# Patient Record
Sex: Female | Born: 1990 | Race: Black or African American | Hispanic: No | Marital: Single | Smoking: Never smoker
Health system: Southern US, Community
[De-identification: ages and names within clinical notes are randomized; demographics above are authoritative.]

## PROBLEM LIST (undated history)

## (undated) ENCOUNTER — Inpatient Hospital Stay (HOSPITAL_COMMUNITY): Payer: Self-pay

## (undated) DIAGNOSIS — D649 Anemia, unspecified: Secondary | ICD-10-CM

## (undated) DIAGNOSIS — I10 Essential (primary) hypertension: Secondary | ICD-10-CM

## (undated) HISTORY — PX: NO PAST SURGERIES: SHX2092

---

## 2015-12-06 ENCOUNTER — Encounter (HOSPITAL_COMMUNITY): Payer: Self-pay | Admitting: Emergency Medicine

## 2015-12-06 ENCOUNTER — Emergency Department (INDEPENDENT_AMBULATORY_CARE_PROVIDER_SITE_OTHER)
Admission: EM | Admit: 2015-12-06 | Discharge: 2015-12-06 | Disposition: A | Discharge status: Home / Self Care | Payer: Medicaid Other | Source: Home / Self Care | Attending: Family Medicine | Admitting: Family Medicine

## 2015-12-06 DIAGNOSIS — Z3490 Encounter for supervision of normal pregnancy, unspecified, unspecified trimester: Secondary | ICD-10-CM

## 2015-12-06 DIAGNOSIS — Z3201 Encounter for pregnancy test, result positive: Secondary | ICD-10-CM | POA: Diagnosis not present

## 2015-12-06 LAB — POCT PREGNANCY, URINE: PREG TEST UR: POSITIVE — AB

## 2015-12-06 NOTE — ED Notes (Signed)
Wants pregnancy test and documentation for work.  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  Has performed 2 home pregnancy tests that are positive

## 2015-12-06 NOTE — ED Provider Notes (Signed)
CSN: [id number]     Arrival date & time 12/06/15  1345 History   First MD Initiated Contact with Patient 12/06/15 1505     Chief Complaint  Patient presents with  . Possible Pregnancy   (Consider location/radiation/quality/duration/timing/severity/associated sxs/prior Treatment) Patient is a 24 y.o. female presenting with pregnancy problem. The history is provided by the patient.  Possible Pregnancy This is a new problem. Episode onset: lmp in 11/16, no birth control, nulliparous. The problem has not changed since onset.Associated symptoms comments: No bleeding, or pain.Marland Kitchen    History reviewed. No pertinent past medical history. History reviewed. No pertinent past surgical history. No family history on file. Social History  Substance Use Topics  . Smoking status: Never Smoker   . Smokeless tobacco: None  . Alcohol Use: No   OB History    No data available     Review of Systems  Constitutional: Negative.   Gastrointestinal: Negative.   Genitourinary: Positive for menstrual problem.  Musculoskeletal: Negative.   Skin: Negative.   All other systems reviewed and are negative.   Allergies  Flagyl  Home Medications   Prior to Admission medications   Not on File   Meds Ordered and Administered this Visit  Medications - No data to display  BP 142/69 mmHg  Pulse 86  Temp(Src) 98.3 F (36.8 C) (Oral)  SpO2 98%  LMP 10/25/2015 No data found.   Physical Exam  Constitutional: She is oriented to person, place, and time. She appears well-developed and well-nourished.  Abdominal: Soft. Bowel sounds are normal. There is no tenderness.  Neurological: She is alert and oriented to person, place, and time.  Skin: Skin is warm and dry.  Nursing note and vitals reviewed.   ED Course  Procedures (including critical care time)  Labs Review Labs Reviewed  POCT PREGNANCY, URINE - Abnormal; Notable for the following:    Preg Test, Ur POSITIVE (*)    All other components  within normal limits    Imaging Review No results found.   Visual Acuity Review  Right Eye Distance:   Left Eye Distance:   Bilateral Distance:    Right Eye Near:   Left Eye Near:    Bilateral Near:         MDM   1. Pregnancy        Billy Fischer, MD 12/06/15 [phone]

## 2015-12-06 NOTE — Discharge Instructions (Signed)
See your doctor for prenatal care. Start a prenatal vitamin.

## 2015-12-10 NOTE — L&D Delivery Note (Signed)
Patient is 25 y.o. G1P0 [redacted]w[redacted]d admitted for IOL 2/2 to postdates. She received Cytotec, foley bulb, and pitocin for induction.   Delivery Note At 2:08 PM a viable female was delivered via Vaginal, Spontaneous Delivery (Presentation:  Right Occiput Anterior ) with terminal meconium.  APGAR: 7, 8; weight pending .   Placenta status: spontaneous and intact. Cord: 3 vessel with the following complications: none.  Cord pH: sent   Anesthesia:  Epidural  Episiotomy: None Lacerations: None Suture Repair: none Est. Blood Loss (mL): 100  Mom to postpartum.  Baby to Couplet care / Skin to Skin.  Melina Schools, DO  08/08/2016, 2:58 PM   OB FELLOW DELIVERY ATTESTATION  I was gloved and present for the delivery in its entirety, and I agree with the above resident's note.    Katherine Basset, DO OB Fellow 11:25 PM

## 2015-12-14 ENCOUNTER — Emergency Department (HOSPITAL_COMMUNITY)
Admission: EM | Admit: 2015-12-14 | Discharge: 2015-12-14 | Disposition: A | Discharge status: Home / Self Care | Payer: PRIVATE HEALTH INSURANCE | Attending: Emergency Medicine | Admitting: Emergency Medicine

## 2015-12-14 ENCOUNTER — Emergency Department (HOSPITAL_COMMUNITY): Payer: PRIVATE HEALTH INSURANCE

## 2015-12-14 ENCOUNTER — Encounter (HOSPITAL_COMMUNITY): Payer: Self-pay | Admitting: Neurology

## 2015-12-14 DIAGNOSIS — R102 Pelvic and perineal pain: Secondary | ICD-10-CM

## 2015-12-14 DIAGNOSIS — O9989 Other specified diseases and conditions complicating pregnancy, childbirth and the puerperium: Secondary | ICD-10-CM | POA: Insufficient documentation

## 2015-12-14 DIAGNOSIS — R1030 Lower abdominal pain, unspecified: Secondary | ICD-10-CM | POA: Diagnosis not present

## 2015-12-14 DIAGNOSIS — Z79899 Other long term (current) drug therapy: Secondary | ICD-10-CM | POA: Insufficient documentation

## 2015-12-14 DIAGNOSIS — O210 Mild hyperemesis gravidarum: Secondary | ICD-10-CM | POA: Insufficient documentation

## 2015-12-14 DIAGNOSIS — Z3A01 Less than 8 weeks gestation of pregnancy: Secondary | ICD-10-CM | POA: Diagnosis not present

## 2015-12-14 DIAGNOSIS — O26899 Other specified pregnancy related conditions, unspecified trimester: Secondary | ICD-10-CM

## 2015-12-14 DIAGNOSIS — R112 Nausea with vomiting, unspecified: Secondary | ICD-10-CM

## 2015-12-14 DIAGNOSIS — R109 Unspecified abdominal pain: Secondary | ICD-10-CM

## 2015-12-14 LAB — URINALYSIS, ROUTINE W REFLEX MICROSCOPIC
Bilirubin Urine: NEGATIVE
Glucose, UA: NEGATIVE mg/dL
Hgb urine dipstick: NEGATIVE
Ketones, ur: >80 mg/dL — AB
LEUKOCYTES UA: NEGATIVE
NITRITE: NEGATIVE
PH: 5.5 (ref 5.0–8.0)
Protein, ur: NEGATIVE mg/dL
SPECIFIC GRAVITY, URINE: 1.021 (ref 1.005–1.030)

## 2015-12-14 LAB — CBC
HEMATOCRIT: 37.2 % (ref 36.0–46.0)
HEMOGLOBIN: 12.5 g/dL (ref 12.0–15.0)
MCH: 29.3 pg (ref 26.0–34.0)
MCHC: 33.6 g/dL (ref 30.0–36.0)
MCV: 87.1 fL (ref 78.0–100.0)
Platelets: 336 10*3/uL (ref 150–400)
RBC: 4.27 MIL/uL (ref 3.87–5.11)
RDW: 12.3 % (ref 11.5–15.5)
WBC: 6 10*3/uL (ref 4.0–10.5)

## 2015-12-14 LAB — COMPREHENSIVE METABOLIC PANEL
ALBUMIN: 3.7 g/dL (ref 3.5–5.0)
ALT: 12 U/L — ABNORMAL LOW (ref 14–54)
ANION GAP: 10 (ref 5–15)
AST: 17 U/L (ref 15–41)
Alkaline Phosphatase: 60 U/L (ref 38–126)
BILIRUBIN TOTAL: 0.8 mg/dL (ref 0.3–1.2)
BUN: 6 mg/dL (ref 6–20)
CALCIUM: 9.4 mg/dL (ref 8.9–10.3)
CO2: 22 mmol/L (ref 22–32)
Chloride: 101 mmol/L (ref 101–111)
Creatinine, Ser: 0.57 mg/dL (ref 0.44–1.00)
GFR calc non Af Amer: >60 mL/min (ref >60)
GLUCOSE: 84 mg/dL (ref 65–99)
POTASSIUM: 3.6 mmol/L (ref 3.5–5.1)
Sodium: 133 mmol/L — ABNORMAL LOW (ref 135–145)
TOTAL PROTEIN: 7.9 g/dL (ref 6.5–8.1)

## 2015-12-14 LAB — I-STAT BETA HCG BLOOD, ED (MC, WL, AP ONLY)

## 2015-12-14 LAB — LIPASE, BLOOD: Lipase: 25 U/L (ref 11–51)

## 2015-12-14 MED ORDER — SODIUM CHLORIDE 0.9 % IV BOLUS (SEPSIS)
1000.0000 mL | Freq: Once | INTRAVENOUS | Status: AC
  Administered 2015-12-14: 1000 mL via INTRAVENOUS

## 2015-12-14 MED ORDER — ONDANSETRON HCL 4 MG/2ML IJ SOLN
4.0000 mg | Freq: Once | INTRAMUSCULAR | Status: AC
  Administered 2015-12-14: 4 mg via INTRAVENOUS
  Filled 2015-12-14: qty 2

## 2015-12-14 MED ORDER — ONDANSETRON HCL 4 MG PO TABS: 4.0000 mg | ORAL_TABLET | Freq: Four times a day (QID) | ORAL | Status: DC

## 2015-12-14 NOTE — ED Notes (Signed)
Pt reports generalized abd pain for 1 week, is also [redacted] weeks pregnant. Has been vomiting and thinks she is dehydrated.

## 2015-12-14 NOTE — ED Notes (Signed)
Pt states she is unable to void at present 

## 2015-12-14 NOTE — ED Provider Notes (Signed)
CSN: [id number]     Arrival date & time 12/14/15  0714 History   First MD Initiated Contact with Patient 12/14/15 [phone]     Chief Complaint  Patient presents with  . Abdominal Pain  . Emesis    (Consider location/radiation/quality/duration/timing/severity/associated sxs/prior Treatment) Patient is a 25 y.o. female presenting with abdominal pain and vomiting. The history is provided by the patient. No language interpreter was used.  Abdominal Pain Associated symptoms: vomiting   Emesis Associated symptoms: abdominal pain     Melanie Lopez is a 25 year old female who is [redacted] weeks pregnant with no significant past medical history who presents for generalized abdominal pain 1 week and multiple episodes of vomiting. She states she tried to get an appointment at the health Department but they could not schedule her for another month. She reports feeling dehydrated. She denies having a previous ultrasound and states that she was diagnosed with pregnancy at urgent care. She reports being constipated for the past couple of weeks. She denies any treatment prior to arrival. Her last menstrual period was 10/25/2015. G1 P0 A0 She denies any fever, chills, shortness of breath, diarrhea, dysuria, hematuria, vaginal bleeding or discharge.  History reviewed. No pertinent past medical history. History reviewed. No pertinent past surgical history. No family history on file. Social History  Substance Use Topics  . Smoking status: Never Smoker   . Smokeless tobacco: None  . Alcohol Use: No   OB History    No data available     Review of Systems  Gastrointestinal: Positive for vomiting and abdominal pain.  All other systems reviewed and are negative.     Allergies  Flagyl  Home Medications   Prior to Admission medications   Medication Sig Start Date End Date Taking? Authorizing Provider  Iron-Vitamins (GERITOL PO) Take 1 tablet by mouth daily as needed (low iron levels).   Yes Historical  Provider, MD  ondansetron (ZOFRAN) 4 MG tablet Take 1 tablet (4 mg total) by mouth every 6 (six) hours. 12/14/15   Naidelyn Parrella Patel-Mills, PA-C   BP 116/68 mmHg  Pulse 53  Temp(Src) 98.4 F (36.9 C) (Oral)  Resp 16  SpO2 92%  LMP 10/25/2015 Physical Exam  Constitutional: She is oriented to person, place, and time. She appears well-developed and well-nourished.  HENT:  Head: Normocephalic and atraumatic.  Eyes: Conjunctivae are normal.  Neck: Normal range of motion. Neck supple.  Cardiovascular: Normal rate, regular rhythm and normal heart sounds.   Pulmonary/Chest: Effort normal and breath sounds normal. No respiratory distress.  Abdominal: Soft. There is tenderness.    Abdominal tenderness as diagrammed. No guarding or rebound.  Musculoskeletal: Normal range of motion.  Neurological: She is alert and oriented to person, place, and time.  Skin: Skin is warm and dry.  Nursing note and vitals reviewed.   ED Course  Procedures (including critical care time) Labs Review Labs Reviewed  COMPREHENSIVE METABOLIC PANEL - Abnormal; Notable for the following:    Sodium 133 (*)    ALT 12 (*)    All other components within normal limits  URINALYSIS, ROUTINE W REFLEX MICROSCOPIC (NOT AT [facility]) - Abnormal; Notable for the following:    Ketones, ur >80 (*)    All other components within normal limits  I-STAT BETA HCG BLOOD, ED ([facility], [facility], [facility] ONLY) - Abnormal; Notable for the following:    I-stat hCG, quantitative >2000.0 (*)    All other components within normal limits  LIPASE, BLOOD  CBC    Imaging  Review US Ob Comp Less 14 Wks  12/14/2015  CLINICAL DATA:  One week history of abdominal and pelvic pain with nausea and vomiting EXAM: OBSTETRIC <14 WK [place] AND TRANSVAGINAL OB US TECHNIQUE: Both transabdominal and transvaginal ultrasound examinations were performed for complete evaluation of the gestation as well as the maternal uterus, adnexal regions, and pelvic cul-de-sac. Transvaginal technique  was performed to assess early pregnancy. COMPARISON:  None. FINDINGS: Intrauterine gestational sac: Visualized/normal in shape. Yolk sac:  Visualized Embryo:  Visualized Cardiac Activity: Visualized Heart Rate: 133  bpm CRL:  10  mm   7 w   1 d                  [place] [facility]: July 31, 2016 Subchorionic hemorrhage: There is a 6 x 3 mm subchorionic hemorrhage. Maternal uterus/adnexae: Cervical os is closed. No intrauterine mass appreciable. Maternal ovaries appear symmetric and normal bilaterally. A small amount of free pelvic fluid is noted. IMPRESSION: Single live intrauterine gestation with estimated gestational age of approximately 55 weeks. Rather tiny subchorionic hemorrhage. Small amount of free pelvic fluid may indicate recent ovarian cyst rupture. No extrauterine pelvic masses are identified. Electronically Signed   By: Lowella Grip III M.D.   On: 12/14/2015 11:51   US Ob Transvaginal  12/14/2015  CLINICAL DATA:  One week history of abdominal and pelvic pain with nausea and vomiting EXAM: OBSTETRIC <14 WK [place] AND TRANSVAGINAL OB US TECHNIQUE: Both transabdominal and transvaginal ultrasound examinations were performed for complete evaluation of the gestation as well as the maternal uterus, adnexal regions, and pelvic cul-de-sac. Transvaginal technique was performed to assess early pregnancy. COMPARISON:  None. FINDINGS: Intrauterine gestational sac: Visualized/normal in shape. Yolk sac:  Visualized Embryo:  Visualized Cardiac Activity: Visualized Heart Rate: 133  bpm CRL:  10  mm   7 w   1 d                  [place] [facility]: July 31, 2016 Subchorionic hemorrhage: There is a 6 x 3 mm subchorionic hemorrhage. Maternal uterus/adnexae: Cervical os is closed. No intrauterine mass appreciable. Maternal ovaries appear symmetric and normal bilaterally. A small amount of free pelvic fluid is noted. IMPRESSION: Single live intrauterine gestation with estimated gestational age of approximately 71 weeks. Rather tiny subchorionic  hemorrhage. Small amount of free pelvic fluid may indicate recent ovarian cyst rupture. No extrauterine pelvic masses are identified. Electronically Signed   By: Lowella Grip III M.D.   On: 12/14/2015 11:51   I have personally reviewed and evaluated these images and lab results as part of my medical decision-making.   EKG Interpretation None      MDM   Final diagnoses:  Abdominal pain affecting pregnancy  Non-intractable vomiting with nausea, vomiting of unspecified type  Patient who is [redacted] weeks pregnant, G1 P0 A0, presents with lower abdominal pain and nausea with multiple episodes of vomiting times one week. She has not seen an OB/GYN to date. She was diagnosed at urgent care a few weeks ago. She is well-appearing and in no acute distress. Her vital signs are stable. An transvaginal and pelvic ultrasound were ordered to rule out ectopic pregnancy. Patient was given IV Zofran and fluids.  Her labs are unremarkable. Ultrasound shows single intrauterine gestation with a heart rate of 133 and gestational age of approximately 53 weeks. There is a tiny subchorionic hemorrhage. She denies any vaginal bleeding or discharge. It also suggests that there may be a recent ovarian cyst  rupture. Urinalysis is negative for UTI.  I discussed findings with the patient as well as follow-up with women's outpatient clinic or the health department. She was prescribed Zofran for nausea. Return precautions were discussed and patient agrees with plan. She was given a work note.    Ottie Glazier, PA-C 12/14/15 [place], MD 12/15/15 [phone]

## 2015-12-14 NOTE — Discharge Instructions (Signed)
Abdominal Pain, Adult Many things can cause belly (abdominal) pain. Most times, the belly pain is not dangerous. Many cases of belly pain can be watched and treated at home. HOME CARE   Do not take medicines that help you go poop (laxatives) unless told to by your doctor.  Only take medicine as told by your doctor.  Eat or drink as told by your doctor. Your doctor will tell you if you should be on a special diet. GET HELP IF:  You do not know what is causing your belly pain.  You have belly pain while you are sick to your stomach (nauseous) or have runny poop (diarrhea).  You have pain while you pee or poop.  Your belly pain wakes you up at night.  You have belly pain that gets worse or better when you eat.  You have belly pain that gets worse when you eat fatty foods.  You have a fever. GET HELP RIGHT AWAY IF:   The pain does not go away within 2 hours.  You keep throwing up (vomiting).  The pain changes and is only in the right or left part of the belly.  You have bloody or tarry looking poop. MAKE SURE YOU:   Understand these instructions.  Will watch your condition.  Will get help right away if you are not doing well or get worse.   This information is not intended to replace advice given to you by your health care provider. Make sure you discuss any questions you have with your health care provider.   Document Released: 05/13/2008 Document Revised: 12/16/2014 Document Reviewed: 08/04/2013 Elsevier Interactive Patient Education 2016 Elsevier Inc.  Nausea and Vomiting Nausea means you feel sick to your stomach. Throwing up (vomiting) is a reflex where stomach contents come out of your mouth. HOME CARE   Take medicine as told by your doctor.  Do not force yourself to eat. However, you do need to drink fluids.  If you feel like eating, eat a normal diet as told by your doctor.  Eat rice, wheat, potatoes, bread, lean meats, yogurt, fruits, and  vegetables.  Avoid high-fat foods.  Drink enough fluids to keep your pee (urine) clear or pale yellow.  Ask your doctor how to replace body fluid losses (rehydrate). Signs of body fluid loss (dehydration) include:  Feeling very thirsty.  Dry lips and mouth.  Feeling dizzy.  Dark pee.  Peeing less than normal.  Feeling confused.  Fast breathing or heart rate. GET HELP RIGHT AWAY IF:   You have blood in your throw up.  You have black or bloody poop (stool).  You have a bad headache or stiff neck.  You feel confused.  You have bad belly (abdominal) pain.  You have chest pain or trouble breathing.  You do not pee at least once every 8 hours.  You have cold, clammy skin.  You keep throwing up after 24 to 48 hours.  You have a fever. MAKE SURE YOU:   Understand these instructions.  Will watch your condition.  Will get help right away if you are not doing well or get worse.   This information is not intended to replace advice given to you by your health care provider. Make sure you discuss any questions you have with your health care provider.   Document Released: 05/13/2008 Document Revised: 02/17/2012 Document Reviewed: 04/26/2011 Elsevier Interactive Patient Education [employer].

## 2015-12-14 NOTE — ED Notes (Signed)
Patient transported to Ultrasound 

## 2015-12-21 ENCOUNTER — Encounter (HOSPITAL_COMMUNITY): Payer: Self-pay | Admitting: Emergency Medicine

## 2015-12-21 DIAGNOSIS — O418X11 Other specified disorders of amniotic fluid and membranes, first trimester, fetus 1: Secondary | ICD-10-CM | POA: Insufficient documentation

## 2015-12-21 DIAGNOSIS — O9989 Other specified diseases and conditions complicating pregnancy, childbirth and the puerperium: Secondary | ICD-10-CM | POA: Diagnosis present

## 2015-12-21 DIAGNOSIS — Z3A08 8 weeks gestation of pregnancy: Secondary | ICD-10-CM | POA: Diagnosis not present

## 2015-12-21 DIAGNOSIS — O210 Mild hyperemesis gravidarum: Secondary | ICD-10-CM | POA: Diagnosis not present

## 2015-12-21 LAB — URINALYSIS, ROUTINE W REFLEX MICROSCOPIC
BILIRUBIN URINE: NEGATIVE
GLUCOSE, UA: NEGATIVE mg/dL
Hgb urine dipstick: NEGATIVE
Ketones, ur: >80 mg/dL — AB
Leukocytes, UA: NEGATIVE
NITRITE: NEGATIVE
PH: 6 (ref 5.0–8.0)
Protein, ur: 30 mg/dL — AB
SPECIFIC GRAVITY, URINE: 1.036 — AB (ref 1.005–1.030)

## 2015-12-21 LAB — COMPREHENSIVE METABOLIC PANEL
ALBUMIN: 3.8 g/dL (ref 3.5–5.0)
ALT: 11 U/L — ABNORMAL LOW (ref 14–54)
ANION GAP: 12 (ref 5–15)
AST: 15 U/L (ref 15–41)
Alkaline Phosphatase: 54 U/L (ref 38–126)
CHLORIDE: 102 mmol/L (ref 101–111)
CO2: 22 mmol/L (ref 22–32)
Calcium: 9.6 mg/dL (ref 8.9–10.3)
Creatinine, Ser: 0.64 mg/dL (ref 0.44–1.00)
GFR calc Af Amer: >60 mL/min (ref >60)
GFR calc non Af Amer: >60 mL/min (ref >60)
GLUCOSE: 81 mg/dL (ref 65–99)
POTASSIUM: 3.9 mmol/L (ref 3.5–5.1)
SODIUM: 136 mmol/L (ref 135–145)
TOTAL PROTEIN: 8.1 g/dL (ref 6.5–8.1)
Total Bilirubin: 0.4 mg/dL (ref 0.3–1.2)

## 2015-12-21 LAB — CBC
HEMATOCRIT: 38 % (ref 36.0–46.0)
HEMOGLOBIN: 12.8 g/dL (ref 12.0–15.0)
MCH: 29.2 pg (ref 26.0–34.0)
MCHC: 33.7 g/dL (ref 30.0–36.0)
MCV: 86.8 fL (ref 78.0–100.0)
Platelets: 381 10*3/uL (ref 150–400)
RBC: 4.38 MIL/uL (ref 3.87–5.11)
RDW: 12.2 % (ref 11.5–15.5)
WBC: 6 10*3/uL (ref 4.0–10.5)

## 2015-12-21 LAB — I-STAT BETA HCG BLOOD, ED (MC, WL, AP ONLY): I-stat hCG, quantitative: >2000 m[IU]/mL — ABNORMAL HIGH (ref <5)

## 2015-12-21 LAB — URINE MICROSCOPIC-ADD ON

## 2015-12-21 LAB — LIPASE, BLOOD: Lipase: 28 U/L (ref 11–51)

## 2015-12-21 MED ORDER — ONDANSETRON 4 MG PO TBDP
ORAL_TABLET | ORAL | Status: AC
  Filled 2015-12-21: qty 1

## 2015-12-21 MED ORDER — ONDANSETRON 4 MG PO TBDP
4.0000 mg | ORAL_TABLET | Freq: Once | ORAL | Status: AC | PRN
  Administered 2015-12-21: 4 mg via ORAL

## 2015-12-21 NOTE — ED Notes (Addendum)
Pt states she has been having lower abd pain and vomiting for 1 week. Pt was seen here a week ago and no better today. Pt states she has had 6 episodes of vomiting today. Pt also states she is [redacted] weeks pregnant.

## 2015-12-22 ENCOUNTER — Emergency Department (HOSPITAL_COMMUNITY)
Admission: EM | Admit: 2015-12-22 | Discharge: 2015-12-22 | Disposition: A | Discharge status: Home / Self Care | Payer: PRIVATE HEALTH INSURANCE | Attending: Emergency Medicine | Admitting: Emergency Medicine

## 2015-12-22 ENCOUNTER — Emergency Department (HOSPITAL_COMMUNITY): Payer: PRIVATE HEALTH INSURANCE

## 2015-12-22 DIAGNOSIS — O418X11 Other specified disorders of amniotic fluid and membranes, first trimester, fetus 1: Secondary | ICD-10-CM | POA: Diagnosis not present

## 2015-12-22 DIAGNOSIS — O219 Vomiting of pregnancy, unspecified: Secondary | ICD-10-CM

## 2015-12-22 DIAGNOSIS — O468X1 Other antepartum hemorrhage, first trimester: Secondary | ICD-10-CM

## 2015-12-22 DIAGNOSIS — O26899 Other specified pregnancy related conditions, unspecified trimester: Secondary | ICD-10-CM

## 2015-12-22 DIAGNOSIS — R109 Unspecified abdominal pain: Secondary | ICD-10-CM

## 2015-12-22 DIAGNOSIS — O418X10 Other specified disorders of amniotic fluid and membranes, first trimester, not applicable or unspecified: Secondary | ICD-10-CM

## 2015-12-22 MED ORDER — SODIUM CHLORIDE 0.9 % IV BOLUS (SEPSIS)
1000.0000 mL | Freq: Once | INTRAVENOUS | Status: AC
  Administered 2015-12-22: 1000 mL via INTRAVENOUS

## 2015-12-22 MED ORDER — ONDANSETRON HCL 4 MG PO TABS: 4.0000 mg | ORAL_TABLET | Freq: Three times a day (TID) | ORAL | Status: DC | PRN

## 2015-12-22 MED ORDER — ONDANSETRON HCL 4 MG/2ML IJ SOLN
4.0000 mg | Freq: Once | INTRAMUSCULAR | Status: AC
  Administered 2015-12-22: 4 mg via INTRAVENOUS
  Filled 2015-12-22: qty 2

## 2015-12-22 MED ORDER — THIAMINE HCL 100 MG/ML IJ SOLN
100.0000 mg | Freq: Every day | INTRAMUSCULAR | Status: DC
  Administered 2015-12-22: 100 mg via INTRAVENOUS
  Filled 2015-12-22: qty 2

## 2015-12-22 MED ORDER — DOXYLAMINE-PYRIDOXINE 10-10 MG PO TBEC: 1.0000 | DELAYED_RELEASE_TABLET | Freq: Every evening | ORAL | Status: DC | PRN

## 2015-12-22 NOTE — ED Provider Notes (Signed)
CSN: [id number]     Arrival date & time 12/21/15  2236 History  By signing my name below, I, Arianna Nassar, attest that this documentation has been prepared under the direction and in the presence of Merryl Hacker, MD. Electronically Signed: Julien Nordmann, ED Scribe. 12/22/2015. 3:51 AM.    Chief Complaint  Patient presents with  . Abdominal Pain      The history is provided by the patient. No language interpreter was used.   HPI Comments: Melanie Lopez is a 25 y.o. female who is [redacted]wk pregnant presents to the Emergency Department complaining of constant, gradual worsening lower abdominal pain with associated vaginal discharge and vomiting onset one week ago. Pt states she has had 6 episodes of vomiting today. She reports having increased abdominal pain when she lays down and is unable to lay on her right side at all due to increased pain. Pt denies vaginal bleeding and constipation.  History reviewed. No pertinent past medical history. History reviewed. No pertinent past surgical history. No family history on file. Social History  Substance Use Topics  . Smoking status: Never Smoker   . Smokeless tobacco: None  . Alcohol Use: No   OB History    No data available     Review of Systems  Gastrointestinal: Positive for vomiting and abdominal pain. Negative for constipation.  Genitourinary: Positive for vaginal discharge. Negative for vaginal bleeding.  All other systems reviewed and are negative.     Allergies  Flagyl  Home Medications   Prior to Admission medications   Medication Sig Start Date End Date Taking? Authorizing Provider  Doxylamine-Pyridoxine (DICLEGIS) 10-10 MG TBEC Take 1 tablet by mouth at bedtime as needed. 12/22/15   Merryl Hacker, MD  Iron-Vitamins (GERITOL PO) Take 1 tablet by mouth daily as needed (low iron levels).    Historical Provider, MD  ondansetron (ZOFRAN) 4 MG tablet Take 1 tablet (4 mg total) by mouth every 8 (eight) hours as needed for  nausea or vomiting. 12/22/15   Merryl Hacker, MD   Triage vitals: BP 120/73 mmHg  Pulse 74  Temp(Src) 98.1 F (36.7 C) (Oral)  Resp 16  Ht 5\' 5"  (1.651 m)  Wt 153 lb 7 oz (69.599 kg)  BMI 25.53 kg/m2  SpO2 100%  LMP 10/25/2015 Physical Exam  Constitutional: She is oriented to person, place, and time. She appears well-developed and well-nourished.  HENT:  Head: Normocephalic and atraumatic.  Eyes: Pupils are equal, round, and reactive to light.  Cardiovascular: Normal rate, regular rhythm and normal heart sounds.   No murmur heard. Pulmonary/Chest: Effort normal and breath sounds normal. No respiratory distress. She has no wheezes.  Abdominal: Soft. Bowel sounds are normal. There is tenderness. There is no rebound and no guarding.  MIld suprapubic TTP, no lateralization, no peritonitis  Neurological: She is alert and oriented to person, place, and time.  Skin: Skin is warm and dry.  Psychiatric: She has a normal mood and affect.  Nursing note and vitals reviewed.   ED Course  Procedures  DIAGNOSTIC STUDIES: Oxygen Saturation is 100% on RA, normal by my interpretation.  COORDINATION OF CARE:  3:42 AM Discussed treatment plan which includes fluids, [place] with pt at bedside and pt agreed to plan.  Labs Review Labs Reviewed  COMPREHENSIVE METABOLIC PANEL - Abnormal; Notable for the following:    BUN <5 (*)    ALT 11 (*)    All other components within normal limits  URINALYSIS, ROUTINE W REFLEX  MICROSCOPIC (NOT AT [facility]) - Abnormal; Notable for the following:    Color, Urine AMBER (*)    Specific Gravity, Urine 1.036 (*)    Ketones, ur >80 (*)    Protein, ur 30 (*)    All other components within normal limits  URINE MICROSCOPIC-ADD ON - Abnormal; Notable for the following:    Squamous Epithelial / LPF 6-30 (*)    Bacteria, UA RARE (*)    All other components within normal limits  I-STAT BETA HCG BLOOD, ED ([facility], [facility], [facility] ONLY) - Abnormal; Notable for the following:    I-stat  hCG, quantitative >2000.0 (*)    All other components within normal limits  LIPASE, BLOOD  CBC    Imaging Review US Ob Comp Less 14 Wks  12/22/2015  CLINICAL DATA:  25 year old pregnant female with abdominal pain, nausea and vomiting EXAM: OBSTETRIC <14 WK [place] AND TRANSVAGINAL OB US TECHNIQUE: Both transabdominal and transvaginal ultrasound examinations were performed for complete evaluation of the gestation as well as the maternal uterus, adnexal regions, and pelvic cul-de-sac. Transvaginal technique was performed to assess early pregnancy. COMPARISON:  Ultrasound dated 12/14/2015 FINDINGS: Intrauterine gestational sac: Single intrauterine gestational sac Yolk sac:  Seen Embryo:  Present Cardiac Activity: Detected Heart Rate: 152  bpm CRL:  19  mm   8 w   3 d                  [place] [facility]: 07/30/2016 Subchorionic hemorrhage:  None visualized. Maternal uterus/adnexae: The maternal ovaries appear unremarkable. The right ovary measures 3.5 x 2.6 x 2.5 cm and the left ovary measures 3.1 x 2.0 x 2.6 cm. Trace free fluid noted within the pelvis. Low-level echogenic debris noted within the bladder. IMPRESSION: Single live intrauterine pregnancy with an estimated gestational age of [redacted] weeks, 2 days based on the first ultrasound. Small subchorionic hemorrhage. Electronically Signed   By: Anner Crete M.D.   On: 12/22/2015 05:20   US Ob Transvaginal  12/22/2015  CLINICAL DATA:  25 year old pregnant female with abdominal pain, nausea and vomiting EXAM: OBSTETRIC <14 WK [place] AND TRANSVAGINAL OB US TECHNIQUE: Both transabdominal and transvaginal ultrasound examinations were performed for complete evaluation of the gestation as well as the maternal uterus, adnexal regions, and pelvic cul-de-sac. Transvaginal technique was performed to assess early pregnancy. COMPARISON:  Ultrasound dated 12/14/2015 FINDINGS: Intrauterine gestational sac: Single intrauterine gestational sac Yolk sac:  Seen Embryo:  Present Cardiac Activity:  Detected Heart Rate: 152  bpm CRL:  19  mm   8 w   3 d                  [place] ED[facility]: 07/30/2016 Subchorionic hemorrhage:  None visualized. Maternal uterus/adnexae: The maternal ovaries appear unremarkable. The right ovary measures 3.5 x 2.6 x 2.5 cm and the left ovary measures 3.1 x 2.0 x 2.6 cm. Trace free fluid noted within the pelvis. Low-level echogenic debris noted within the bladder. IMPRESSION: Single live intrauterine pregnancy with an estimated gestational age of [redacted] weeks, 2 days based on the first ultrasound. Small subchorionic hemorrhage. Electronically Signed   By: Anner Crete M.D.   On: 12/22/2015 05:20   I have personally reviewed and evaluated these images and lab results as part of my medical decision-making.   EKG Interpretation None      MDM   Final diagnoses:  Vomiting during pregnancy  Subchorionic hematoma in first trimester    Patient with vomiting and abdominal pain in early  pregnancy.  Has had an [place] to show intrauterine pregnancy.  >80 ketones in the urine.  Patient given fluids, zofran and thiamine.  Repeat US obtained and continues to be reassuring.  Patient improved and able to PO challenge after hydration.  Discussed with patient the recent studies regarding zofran in early pregnancy.  She states that it works well for her.  I have recommended trying diclegis and if that does not help, then she will be given zofran as a back up.  Call OB for close follow-up.   After history, exam, and medical workup I feel the patient has been appropriately medically screened and is safe for discharge home. Pertinent diagnoses were discussed with the patient. Patient was given return precautions.  I personally performed the services described in this documentation, which was scribed in my presence. The recorded information has been reviewed and is accurate.    Merryl Hacker, MD 12/22/15 [phone]

## 2015-12-22 NOTE — ED Notes (Signed)
PO Challenge started

## 2015-12-22 NOTE — ED Notes (Signed)
Family at bedside. 

## 2015-12-22 NOTE — Discharge Instructions (Signed)
Subchorionic Hematoma A subchorionic hematoma is a gathering of blood between the outer wall of the placenta and the inner wall of the womb (uterus). The placenta is the organ that connects the fetus to the wall of the uterus. The placenta performs the feeding, breathing (oxygen to the fetus), and waste removal (excretory work) of the fetus.  Subchorionic hematoma is the most common abnormality found on a result from ultrasonography done during the first trimester or early second trimester of pregnancy. If there has been little or no vaginal bleeding, early small hematomas usually shrink on their own and do not affect your baby or pregnancy. The blood is gradually absorbed over 1-2 weeks. When bleeding starts later in pregnancy or the hematoma is larger or occurs in an older pregnant woman, the outcome may not be as good. Larger hematomas may get bigger, which increases the chances for miscarriage. Subchorionic hematoma also increases the risk of premature detachment of the placenta from the uterus, preterm (premature) labor, and stillbirth. HOME CARE INSTRUCTIONS  Stay on bed rest if your health care provider recommends this. Although bed rest will not prevent more bleeding or prevent a miscarriage, your health care provider may recommend bed rest until you are advised otherwise.  Avoid heavy lifting (more than 10 lb [4.5 kg]), exercise, sexual intercourse, or douching as directed by your health care provider.  Keep track of the number of pads you use each day and how soaked (saturated) they are. Write down this information.  Do not use tampons.  Keep all follow-up appointments as directed by your health care provider. Your health care provider may ask you to have follow-up blood tests or ultrasound tests or both. SEEK IMMEDIATE MEDICAL CARE IF:  You have severe cramps in your stomach, back, abdomen, or pelvis.  You have a fever.  You pass large clots or tissue. Save any tissue for your health  care provider to look at.  Your bleeding increases or you become lightheaded, feel weak, or have fainting episodes.   This information is not intended to replace advice given to you by your health care provider. Make sure you discuss any questions you have with your health care provider.   Document Released: 03/12/2007 Document Revised: 12/16/2014 Document Reviewed: 06/24/2013 Elsevier Interactive Patient Education 2016 Elsevier Inc. Morning Sickness Morning sickness is when you feel sick to your stomach (nauseous) during pregnancy. This nauseous feeling may or may not come with vomiting. It often occurs in the morning but can be a problem any time of day. Morning sickness is most common during the first trimester, but it may continue throughout pregnancy. While morning sickness is unpleasant, it is usually harmless unless you develop severe and continual vomiting (hyperemesis gravidarum). This condition requires more intense treatment.  CAUSES  The cause of morning sickness is not completely known but seems to be related to normal hormonal changes that occur in pregnancy. RISK FACTORS You are at greater risk if you:  Experienced nausea or vomiting before your pregnancy.  Had morning sickness during a previous pregnancy.  Are pregnant with more than one baby, such as twins. TREATMENT  Do not use any medicines (prescription, over-the-counter, or herbal) for morning sickness without first talking to your health care provider. Your health care provider may prescribe or recommend:  Vitamin B6 supplements.  Anti-nausea medicines.  The herbal medicine ginger. HOME CARE INSTRUCTIONS   Only take over-the-counter or prescription medicines as directed by your health care provider.  Taking multivitamins before getting pregnant  can prevent or decrease the severity of morning sickness in most women.  Eat a piece of dry toast or unsalted crackers before getting out of bed in the morning.  Eat  five or six small meals a day.  Eat dry and bland foods (rice, baked potato). Foods high in carbohydrates are often helpful.  Do not drink liquids with your meals. Drink liquids between meals.  Avoid greasy, fatty, and spicy foods.  Get someone to cook for you if the smell of any food causes nausea and vomiting.  If you feel nauseous after taking prenatal vitamins, take the vitamins at night or with a snack.  Snack on protein foods (nuts, yogurt, cheese) between meals if you are hungry.  Eat unsweetened gelatins for desserts.  Wearing an acupressure wristband (worn for sea sickness) may be helpful.  Acupuncture may be helpful.  Do not smoke.  Get a humidifier to keep the air in your house free of odors.  Get plenty of fresh air. SEEK MEDICAL CARE IF:   Your home remedies are not working, and you need medicine.  You feel dizzy or lightheaded.  You are losing weight. SEEK IMMEDIATE MEDICAL CARE IF:   You have persistent and uncontrolled nausea and vomiting.  You pass out (faint). MAKE SURE YOU:  Understand these instructions.  Will watch your condition.  Will get help right away if you are not doing well or get worse.   This information is not intended to replace advice given to you by your health care provider. Make sure you discuss any questions you have with your health care provider.   Document Released: 01/16/2007 Document Revised: 11/30/2013 Document Reviewed: 05/12/2013 Elsevier Interactive Patient Education [employer].

## 2016-01-04 LAB — OB RESULTS CONSOLE RPR: RPR: NONREACTIVE

## 2016-01-04 LAB — CYSTIC FIBROSIS DIAGNOSTIC STUDY: Interpretation-CFDNA:: NEGATIVE

## 2016-01-04 LAB — OB RESULTS CONSOLE HEPATITIS B SURFACE ANTIGEN: HEP B S AG: NEGATIVE

## 2016-01-04 LAB — OB RESULTS CONSOLE ABO/RH: RH TYPE: POSITIVE

## 2016-01-04 LAB — OB RESULTS CONSOLE ANTIBODY SCREEN: Antibody Screen: NEGATIVE

## 2016-01-04 LAB — OB RESULTS CONSOLE VARICELLA ZOSTER ANTIBODY, IGG: Varicella: IMMUNE

## 2016-01-04 LAB — OB RESULTS CONSOLE HIV ANTIBODY (ROUTINE TESTING): HIV: NONREACTIVE

## 2016-01-04 LAB — OB RESULTS CONSOLE RUBELLA ANTIBODY, IGM: Rubella: IMMUNE

## 2016-01-27 ENCOUNTER — Inpatient Hospital Stay (HOSPITAL_COMMUNITY)
Admission: AD | Admit: 2016-01-27 | Discharge: 2016-01-27 | Disposition: A | Discharge status: Home / Self Care | Payer: PRIVATE HEALTH INSURANCE | Source: Ambulatory Visit | Attending: Obstetrics and Gynecology | Admitting: Obstetrics and Gynecology

## 2016-01-27 ENCOUNTER — Encounter (HOSPITAL_COMMUNITY): Payer: Self-pay

## 2016-01-27 DIAGNOSIS — O210 Mild hyperemesis gravidarum: Secondary | ICD-10-CM | POA: Insufficient documentation

## 2016-01-27 DIAGNOSIS — O219 Vomiting of pregnancy, unspecified: Secondary | ICD-10-CM

## 2016-01-27 DIAGNOSIS — Z3A13 13 weeks gestation of pregnancy: Secondary | ICD-10-CM | POA: Diagnosis not present

## 2016-01-27 DIAGNOSIS — R42 Dizziness and giddiness: Secondary | ICD-10-CM | POA: Diagnosis present

## 2016-01-27 DIAGNOSIS — R55 Syncope and collapse: Secondary | ICD-10-CM | POA: Insufficient documentation

## 2016-01-27 HISTORY — DX: Anemia, unspecified: D64.9

## 2016-01-27 LAB — URINALYSIS, ROUTINE W REFLEX MICROSCOPIC
Bilirubin Urine: NEGATIVE
Glucose, UA: 100 mg/dL — AB
Hgb urine dipstick: NEGATIVE
Ketones, ur: 15 mg/dL — AB
LEUKOCYTES UA: NEGATIVE
NITRITE: NEGATIVE
PROTEIN: 30 mg/dL — AB
Specific Gravity, Urine: 1.02 (ref 1.005–1.030)
pH: 6.5 (ref 5.0–8.0)

## 2016-01-27 LAB — URINE MICROSCOPIC-ADD ON: RBC / HPF: NONE SEEN RBC/hpf (ref 0–5)

## 2016-01-27 LAB — GLUCOSE, CAPILLARY: GLUCOSE-CAPILLARY: 107 mg/dL — AB (ref 65–99)

## 2016-01-27 MED ORDER — PROMETHAZINE HCL 25 MG PO TABS: 25.0000 mg | ORAL_TABLET | Freq: Four times a day (QID) | ORAL | Status: DC | PRN

## 2016-01-27 NOTE — MAU Note (Signed)
C/o N&V for past 2 weeks; c/o intermittent pain at her umbilicus and above; no diarrhea or constipation;

## 2016-01-27 NOTE — MAU Note (Signed)
Patient presents with vomiting, passed out at work around 1:00 pm, abdominal cramping, vomited 5 times today, no vaginal bleeding, LMP 10/25/15

## 2016-01-27 NOTE — MAU Provider Note (Signed)
History     CSN: [id number]  Arrival date and time: 01/27/16 1428   First Provider Initiated Contact with Patient 01/27/16 1519      Chief Complaint  Patient presents with  . Loss of Consciousness  . Emesis During Pregnancy   HPI Melanie Lopez 25 y.o. [redacted]w[redacted]d  Was at work today and was having lots of dizziness.  Works as a Secretary/administrator.  Was sitting on a bed and attempted to stand up.  Became very dizzy and fell back on the bed and passed out.  Awakened and her supervisor called 911.  She did not hit her head.  Has passed out once before (before she was pregnant) and did hit her head.  Came to MAU.  Has had nausea and vomiting earlier today.  Tried yogurt, apples and a banana and vomited that.  Then ate a buffalo chicken sandwich and it stayed down.  Is receiving care at the health department and next appointment is scheduled.  OB History    Gravida Para Term Preterm AB TAB SAB Ectopic Multiple Living   1               Past Medical History  Diagnosis Date  . Anemia     Past Surgical History  Procedure Laterality Date  . No past surgeries      Family History  Problem Relation Age of Onset  . Anemia Mother   . Hypertension Father   . Hypertension Sister   . Anemia Sister   . Anemia Brother   . Anemia Maternal Aunt   . Anemia Maternal Grandmother   . Diabetes Paternal Grandmother   . Hypertension Paternal Grandmother     Social History  Substance Use Topics  . Smoking status: Never Smoker   . Smokeless tobacco: None  . Alcohol Use: No    Allergies:  Allergies  Allergen Reactions  . Flagyl [Metronidazole] Hives and Rash    Prescriptions prior to admission  Medication Sig Dispense Refill Last Dose  . Prenatal Vit-Fe Fumarate-FA (MULTIVITAMIN-PRENATAL) 27-0.8 MG TABS tablet Take 1 tablet by mouth daily at 12 noon.    01/27/2016 at Unknown time  . Doxylamine-Pyridoxine (DICLEGIS) 10-10 MG TBEC Take 1 tablet by mouth at bedtime as needed. (Patient not taking:  Reported on 01/27/2016) 60 tablet 0 Not Taking at Unknown time  . ondansetron (ZOFRAN) 4 MG tablet Take 1 tablet (4 mg total) by mouth every 8 (eight) hours as needed for nausea or vomiting. (Patient not taking: Reported on 01/27/2016) 30 tablet 0 Not Taking at Unknown time    Review of Systems  Constitutional: Negative for fever.  Gastrointestinal: Positive for nausea and vomiting. Negative for abdominal pain.  Genitourinary:       No vaginal discharge. No vaginal bleeding. No dysuria.  Neurological: Positive for dizziness and loss of consciousness.   Physical Exam   Blood pressure 132/87, pulse 100, temperature 98.2 F (36.8 C), temperature source Oral, resp. rate 18, height 5\' 5"  (1.651 m), weight 150 lb (68.04 kg), last menstrual period 10/25/2015, SpO2 100 %.  Physical Exam  Nursing note and vitals reviewed. Constitutional: She is oriented to person, place, and time. She appears well-developed and well-nourished.  HENT:  Head: Normocephalic.  Eyes: EOM are normal.  Neck: Neck supple.  GI: Soft. There is no tenderness.  FHT heard by doppler.  Musculoskeletal: Normal range of motion.  Neurological: She is alert and oriented to person, place, and time.  Skin: Skin is warm and dry.  Psychiatric: She has a normal mood and affect.    MAU Course  Procedures Results for orders placed or performed during the hospital encounter of 01/27/16 (from the past 24 hour(s))  Urinalysis, Routine w reflex microscopic (not at [facility])     Status: Abnormal   Collection Time: 01/27/16  2:50 PM  Result Value Ref Range   Color, Urine YELLOW YELLOW   APPearance CLEAR CLEAR   Specific Gravity, Urine 1.020 1.005 - 1.030   pH 6.5 5.0 - 8.0   Glucose, UA 100 (A) NEGATIVE mg/dL   Hgb urine dipstick NEGATIVE NEGATIVE   Bilirubin Urine NEGATIVE NEGATIVE   Ketones, ur 15 (A) NEGATIVE mg/dL   Protein, ur 30 (A) NEGATIVE mg/dL   Nitrite NEGATIVE NEGATIVE   Leukocytes, UA NEGATIVE NEGATIVE  Urine  microscopic-add on     Status: Abnormal   Collection Time: 01/27/16  2:50 PM  Result Value Ref Range   Squamous Epithelial / LPF 0-5 (A) NONE SEEN   WBC, UA 0-5 0 - 5 WBC/hpf   RBC / HPF NONE SEEN 0 - 5 RBC/hpf   Bacteria, UA RARE (A) NONE SEEN   Urine-Other MUCOUS PRESENT   Glucose, capillary     Status: Abnormal   Collection Time: 01/27/16  2:54 PM  Result Value Ref Range   Glucose-Capillary 107 (H) 65 - 99 mg/dL    MDM Discussed signs of impending loss of consciousness and the plan to avoid passing out.  Discussed eating with morning sickness.  Assessment and Plan  Syncopal episode at 13 weeks of pregnancy Morning sickness  Plan Will eprescribe phenergan to use for vomiting Advise small frequent meals eating every 2-3 hours. Keep your appointment at the health department.  [name] 01/27/2016, 4:44 PM

## 2016-01-27 NOTE — MAU Note (Signed)
Patient goes to [facility] for prenatal care

## 2016-01-27 NOTE — Discharge Instructions (Signed)
Drink at least 8 8-oz glasses of water every day. Take Tylenol 325 mg 2 tablets by mouth every 4 hours if needed for pain. Eat small frequent meals every 2-3 hours Get your medication at the pharmacy and take as needed for vomiting. Keep your appointments at the health department.

## 2016-02-01 LAB — OB RESULTS CONSOLE GC/CHLAMYDIA
CHLAMYDIA, DNA PROBE: NEGATIVE
Gonorrhea: NEGATIVE

## 2016-02-25 ENCOUNTER — Inpatient Hospital Stay (HOSPITAL_COMMUNITY): Payer: Medicaid Other

## 2016-02-25 ENCOUNTER — Encounter (HOSPITAL_COMMUNITY): Payer: Self-pay | Admitting: *Deleted

## 2016-02-25 ENCOUNTER — Inpatient Hospital Stay (HOSPITAL_COMMUNITY)
Admission: AD | Admit: 2016-02-25 | Discharge: 2016-02-25 | Disposition: A | Discharge status: Home / Self Care | Payer: Medicaid Other | Source: Ambulatory Visit | Attending: Obstetrics and Gynecology | Admitting: Obstetrics and Gynecology

## 2016-02-25 DIAGNOSIS — Z3A17 17 weeks gestation of pregnancy: Secondary | ICD-10-CM | POA: Diagnosis not present

## 2016-02-25 DIAGNOSIS — R109 Unspecified abdominal pain: Secondary | ICD-10-CM

## 2016-02-25 DIAGNOSIS — O9989 Other specified diseases and conditions complicating pregnancy, childbirth and the puerperium: Secondary | ICD-10-CM | POA: Diagnosis not present

## 2016-02-25 DIAGNOSIS — O4692 Antepartum hemorrhage, unspecified, second trimester: Secondary | ICD-10-CM

## 2016-02-25 DIAGNOSIS — Z202 Contact with and (suspected) exposure to infections with a predominantly sexual mode of transmission: Secondary | ICD-10-CM

## 2016-02-25 LAB — CBC
HCT: 33.9 % — ABNORMAL LOW (ref 36.0–46.0)
HEMOGLOBIN: 11.8 g/dL — AB (ref 12.0–15.0)
MCH: 30.6 pg (ref 26.0–34.0)
MCHC: 34.8 g/dL (ref 30.0–36.0)
MCV: 88.1 fL (ref 78.0–100.0)
Platelets: 319 10*3/uL (ref 150–400)
RBC: 3.85 MIL/uL — ABNORMAL LOW (ref 3.87–5.11)
RDW: 13.7 % (ref 11.5–15.5)
WBC: 6.9 10*3/uL (ref 4.0–10.5)

## 2016-02-25 LAB — URINALYSIS, ROUTINE W REFLEX MICROSCOPIC
Bilirubin Urine: NEGATIVE
GLUCOSE, UA: 100 mg/dL — AB
HGB URINE DIPSTICK: NEGATIVE
KETONES UR: 40 mg/dL — AB
LEUKOCYTES UA: NEGATIVE
Nitrite: NEGATIVE
PROTEIN: NEGATIVE mg/dL
Specific Gravity, Urine: 1.025 (ref 1.005–1.030)
pH: 6.5 (ref 5.0–8.0)

## 2016-02-25 LAB — WET PREP, GENITAL
Clue Cells Wet Prep HPF POC: NONE SEEN
Sperm: NONE SEEN
TRICH WET PREP: NONE SEEN
YEAST WET PREP: NONE SEEN

## 2016-02-25 LAB — ABO/RH: ABO/RH(D): O POS

## 2016-02-25 MED ORDER — AZITHROMYCIN 250 MG PO TABS
1000.0000 mg | ORAL_TABLET | Freq: Once | ORAL | Status: AC
  Administered 2016-02-25: 1000 mg via ORAL
  Filled 2016-02-25: qty 4

## 2016-02-25 NOTE — Discharge Instructions (Signed)
No intercourse for 7 days after both you & your partner have been treated   Vaginal Bleeding During Pregnancy, Second Trimester A small amount of bleeding (spotting) from the vagina is relatively common in pregnancy. It usually stops on its own. Various things can cause bleeding or spotting in pregnancy. Some bleeding may be related to the pregnancy, and some may not. Sometimes the bleeding is normal and is not a problem. However, bleeding can also be a sign of something serious. Be sure to tell your health care provider about any vaginal bleeding right away. Some possible causes of vaginal bleeding during the second trimester include:  Infection, inflammation, or growths on the cervix.   The placenta may be partially or completely covering the opening of the cervix inside the uterus (placenta previa).  The placenta may have separated from the uterus (abruption of the placenta).   You may be having early (preterm) labor.   The cervix may not be strong enough to keep a baby inside the uterus (cervical insufficiency).   Tiny cysts may have developed in the uterus instead of pregnancy tissue (molar pregnancy). HOME CARE INSTRUCTIONS  Watch your condition for any changes. The following actions may help to lessen any discomfort you are feeling:  Follow your health care provider's instructions for limiting your activity. If your health care provider orders bed rest, you may need to stay in bed and only get up to use the bathroom. However, your health care provider may allow you to continue light activity.  If needed, make plans for someone to help with your regular activities and responsibilities while you are on bed rest.  Keep track of the number of pads you use each day, how often you change pads, and how soaked (saturated) they are. Write this down.  Do not use tampons. Do not douche.  Do not have sexual intercourse or orgasms until approved by your health care provider.  If you  pass any tissue from your vagina, save the tissue so you can show it to your health care provider.  Only take over-the-counter or prescription medicines as directed by your health care provider.  Do not take aspirin because it can make you bleed.  Do not exercise or perform any strenuous activities or heavy lifting without your health care provider's permission.  Keep all follow-up appointments as directed by your health care provider. SEEK MEDICAL CARE IF:  You have any vaginal bleeding during any part of your pregnancy.  You have cramps or labor pains.  You have a fever, not controlled by medicine. SEEK IMMEDIATE MEDICAL CARE IF:   You have severe cramps in your back or belly (abdomen).  You have contractions.  You have chills.  You pass large clots or tissue from your vagina.  Your bleeding increases.  You feel light-headed or weak, or you have fainting episodes.  You are leaking fluid or have a gush of fluid from your vagina. MAKE SURE YOU:  Understand these instructions.  Will watch your condition.  Will get help right away if you are not doing well or get worse.   This information is not intended to replace advice given to you by your health care provider. Make sure you discuss any questions you have with your health care provider.   Document Released: 09/04/2005 Document Revised: 11/30/2013 Document Reviewed: 08/02/2013 Elsevier Interactive Patient Education 2016 [employer].                Expedited Partner Therapy:  Information  Sheet for Patients and Partners               You have been offered expedited partner therapy (EPT). This information sheet contains important information and warnings you need to be aware of, so please read it carefully.   Expedited Partner Therapy (EPT) is the clinical practice of treating the sexual partners of persons who receive chlamydia, gonorrhea, or trichomoniasis diagnoses by providing medications or  prescriptions to the patient. Patients then provide partners with these therapies without the health-care provider having examined the partner. In other words, EPT is a convenient, fast and private way for patients to help their sexual partners get treated.   Chlamydia and gonorrhea are bacterial infections you get from having sex with a person who is already infected. Trichomoniasis (or trich) is a very common sexually transmitted infection (STI) that is caused by infection with a protozoan parasite called Trichomonas vaginalis.  Many people with these infections dont know it because they feel fine, but without treatment these infections can cause serious health problems, such as pelvic inflammatory disease, ectopic pregnancy, infertility and increased risk of HIV.   It is important to get treated as soon as possible to protect your health, to avoid spreading these infections to others, and to prevent yourself from becoming re-infected. The good news is these infections can be easily cured with proper antibiotic medicine. The best way to take care of your self is to see a doctor or go to your local health department. If you are not able to see a doctor or other medical provider, you should take EPT.    Recommended Medication: EPT for Chlamydia:  Azithromycin (Zithromax) 1 gram orally in a single dose EPT for Gonorrhea:  Cefixime (Suprax) 400 milligrams orally in a single dose PLUS azithromycin (Zithromax) 1 gram orally in a single dose EPT for Trichomoniasis:  Metronidazole (Flagyl) 2 grams orally in a single dose   These medicines are very safe. However, you should not take them if you have ever had an allergic reaction (like a rash) to any of these medicines: azithromycin (Zithromax), erythromycin, clarithromycin (Biaxin), metronidazole (Flagyl), tinidazole (Tindimax). If you are uncertain about whether you have an allergy, call your medical provider or pharmacist before taking this medicine. If  you have a serious, long-term illness like kidney, liver or heart disease, colitis or stomach problems, or you are currently taking other prescription medication, talk to your provider before taking this medication.   Women: If you have lower belly pain, pain during sex, vomiting, or a fever, do not take this medicine. Instead, you should see a medical provider to be certain you do not have pelvic inflammatory disease (PID). PID can be serious and lead to infertility, pregnancy problems or chronic pelvic pain.   Pregnant Women: It is very important for you to see a doctor to get pregnancy services and pre-natal care. These antibiotics for EPT are safe for pregnant women, but you still need to see a medical provider as soon as possible. It is also important to note that Doxycycline is an alternative therapy for chlamydia, but it should not be taken by someone who is pregnant.   Men: If you have pain or swelling in the testicles or a fever, do not take this medicine and see a medical provider.     Men who have sex with men (MSM): MSM in [place] continue to experience high rates of syphilis and HIV. Many MSM with gonorrhea or chlamydia could also have  syphilis and/or HIV and not know it. If you are a man who has sex with other men, it is very important that you see a medical provider and are tested for HIV and syphilis. EPT is not recommended for gonorrhea for MSM.  Recommended treatment for gonorrhea for MSM is Rocephin (shot) AND azithromycin due to decreased cure rate.  Please see your medical provider if this is the case.    Along with this information sheet is a prescription for the medicine. If you receive a prescription it will be in your name and will indicate your date of birth, or it will be in the name of Expedited Partner Therapy.   In either case, you can have the prescription filled at a pharmacy. You will be responsible for the cost of the medicine, unless you have prescription drug  coverage. In that case, you could provide your name so the pharmacy could bill your health plan.   Take the medication as directed. Some people will have a mild, upset stomach, which does not last long. AVOID alcohol 24 hours after taking metronidazole (Flagyl) to reduce the possibility of a disulfiram-like reaction (severe vomiting and abdominal pain).  After taking the medicine, do not have sex for 7 days. Do not share this medicine or give it to anyone else. It is important to tell everyone you have had sex with in the last 60 days that they need to go and get tested for sexually transmitted infections.   Ways to prevent these and other sexually transmitted infections (STIs):    Abstain from sex. This is the only sure way to avoid getting an STI.   Use barrier methods, such as condoms, consistently and correctly.   Limit the number of sexual partners.   Have regular physical exams, including testing for STIs.   For more information about EPT or other issues pertaining to an STI, please contact your medical provider or the [facility] Department at [phone] or [email or web address]

## 2016-02-25 NOTE — MAU Note (Signed)
Lower abdominal pain for 2-3 days feeling like some one is poking gets worse with movement.   Today woke up and noticed blood in underwear, bright red. No intercourse in past 2 days.  Nothing in vaginally.  No more bleeding since noticing in underwear

## 2016-02-25 NOTE — Progress Notes (Signed)
History   CSN: [id number]  Arrival date and time: 02/25/16 1112  First Provider Initiated Contact with Patient 02/25/16 1143     Chief Complaint  Patient presents with  . Abdominal Pain  . Vaginal Bleeding   HPI  Melanie Lopez is a 25 yo G19P0 AA female at [redacted]w[redacted]d presenting to the MAU today complaining of lower abdominal pain and vaginal bleeding. The patient's abdominal pain started 3 days ago and has persisted to today. She describes the pain as sharp and stabby, and it occurs with lying supine and with movement. The patient's pain is relieved with sitting upright and remaining still. The patient has not tried treating her pain with medication, as change in body position usually relieves her symptoms. The patient's vaginal bleeding was noticed by the patient this morning when she woke up with a small amount of blood staining her underwear. She has not had any bleeding since. The patient denies pain associated with the bleeding, and denies recent trauma to the area other than unprotected intercourse with her long term partner 4 days ago. The patient reports no bleeding immediately after the intercourse. Of note, the patient tested positive for chlamydia last month, and was treated appropriately. The patient denies vaginal discharge or odor, and also denies urinary burning, bleeding, discharge, odor, frequency, or urgency. The patient denies nausea, vomiting, diarrhea, constipation, chest pain, SOB, palpitations, recent illnesses, sick contacts, and new foods. The patient endorses seasonal allergies.  OB History    Gravida Para Term Preterm AB TAB SAB Ectopic Multiple Living   1               Past Medical History  Diagnosis Date  . Anemia     Past Surgical History  Procedure Laterality Date  . No past surgeries      Family History  Problem Relation Age of Onset  . Anemia Mother   . Hypertension Father   . Hypertension Sister   . Anemia Sister   . Anemia Brother   . Anemia  Maternal Aunt   . Anemia Maternal Grandmother   . Diabetes Paternal Grandmother   . Hypertension Paternal Grandmother     Social History  Substance Use Topics  . Smoking status: Never Smoker   . Smokeless tobacco: None  . Alcohol Use: No    Allergies:  Allergies  Allergen Reactions  . Flagyl [Metronidazole] Hives and Rash    Prescriptions prior to admission  Medication Sig Dispense Refill Last Dose  . Prenatal Vit-Fe Fumarate-FA (MULTIVITAMIN-PRENATAL) 27-0.8 MG TABS tablet Take 1 tablet by mouth daily at 12 noon.    01/27/2016 at Unknown time  . promethazine (PHENERGAN) 25 MG tablet Take 1 tablet (25 mg total) by mouth every 6 (six) hours as needed for nausea or vomiting. 30 tablet 0     Review of Systems  Constitutional: Negative for fever, chills, malaise/fatigue and diaphoresis.  HENT: Negative for congestion, nosebleeds and sore throat.   Eyes: Negative for blurred vision and double vision.  Respiratory: Negative for cough, shortness of breath and wheezing.   Cardiovascular: Negative for chest pain, palpitations, orthopnea and leg swelling.  Gastrointestinal: Positive for abdominal pain. Negative for heartburn, nausea, vomiting, diarrhea, constipation and blood in stool.  Genitourinary: Negative for dysuria, urgency, frequency, hematuria and flank pain.  Musculoskeletal: Negative for myalgias, back pain and joint pain.  Skin: Negative for itching and rash.  Neurological: Negative for dizziness, tingling, focal weakness, seizures, loss of consciousness, weakness and headaches.  Endo/Heme/Allergies: Does  not bruise/bleed easily.   Physical Exam   Blood pressure 138/67, pulse 73, temperature 98.4 F (36.9 C), temperature source Oral, resp. rate 18, height 5\' 5"  (1.651 m), weight 69.582 kg (153 lb 6.4 oz), last menstrual period 10/25/2015.  Physical Exam  Constitutional: She is oriented to person, place, and time. She appears well-developed and well-nourished. No distress.   HENT:  Head: Normocephalic and atraumatic.  Neck: Normal range of motion. Neck supple.  Cardiovascular: Normal rate, regular rhythm, normal heart sounds and intact distal pulses.  Exam reveals no gallop and no friction rub.   No murmur heard. Respiratory: Effort normal and breath sounds normal. No respiratory distress. She has no wheezes. She has no rales. She exhibits no tenderness.  GI: Soft. Normal appearance and bowel sounds are normal. There is tenderness in the suprapubic area. There is no rigidity, no rebound, no guarding and no CVA tenderness.  L>R suprapubic tenderness  Musculoskeletal: Normal range of motion. She exhibits no edema or tenderness.  Lymphadenopathy:    She has no cervical adenopathy.  Neurological: She is alert and oriented to person, place, and time.  Skin: Skin is warm. No rash noted. She is not diaphoretic. No erythema. No pallor.  Psychiatric: She has a normal mood and affect. Her behavior is normal.   MAU Course  Procedures  MDM Results for orders placed or performed during the hospital encounter of 02/25/16 (from the past 24 hour(s))  Urinalysis, Routine w reflex microscopic (not at [facility])     Status: Abnormal   Collection Time: 02/25/16 11:20 AM  Result Value Ref Range   Color, Urine YELLOW YELLOW   APPearance CLEAR CLEAR   Specific Gravity, Urine 1.025 1.005 - 1.030   pH 6.5 5.0 - 8.0   Glucose, UA 100 (A) NEGATIVE mg/dL   Hgb urine dipstick NEGATIVE NEGATIVE   Bilirubin Urine NEGATIVE NEGATIVE   Ketones, ur 40 (A) NEGATIVE mg/dL   Protein, ur NEGATIVE NEGATIVE mg/dL   Nitrite NEGATIVE NEGATIVE   Leukocytes, UA NEGATIVE NEGATIVE   The patient is a 25 yo G1P0 at [redacted]w[redacted]d with a history of chlamydia infection one month ago presenting with scant bleeding and suprapubic pain today. She last had intercourse without protection 4 days ago. The patient's symptoms today may be due to recurrent infection or threatened abortion. CBC and ultrasound have been  ordered to continue workup. Pending ultrasound results, pelvic exam with GC/Chlamydia and wet prep will be performed.  Assessment and Plan  Abdominal pain in 1st trimester pregnancy - Review results of CBC and ultrasound - Complete pelvic exam with testing - Review results with patient - Follow-up with OB/GYN at health department - Call or visit in the interim with questions or concerns  Pasty Spillers PA-S 02/25/2016, 11:49 AM

## 2016-02-25 NOTE — MAU Provider Note (Signed)
History     CSN: [id number]  Arrival date and time: 02/25/16 1112   First Provider Initiated Contact with Patient 02/25/16 1143      Chief Complaint  Patient presents with  . Abdominal Pain  . Vaginal Bleeding   HPI Melanie Lopez is a 25 y.o. G1P0 at [redacted]w[redacted]d who presents for vaginal bleeding. Reports episode of dark red blood on toilet paper x 1 this morning. Some mild abdominal cramping over the weekend. Denies LOF, vaginal discharge, n/v/d, constipation, or dysuria. Getting prenatal care at the health department. Has not had intercourse in over a week.    OB History    Gravida Para Term Preterm AB TAB SAB Ectopic Multiple Living   1               Past Medical History  Diagnosis Date  . Anemia     Past Surgical History  Procedure Laterality Date  . No past surgeries      Family History  Problem Relation Age of Onset  . Anemia Mother   . Hypertension Father   . Hypertension Sister   . Anemia Sister   . Anemia Brother   . Anemia Maternal Aunt   . Anemia Maternal Grandmother   . Diabetes Paternal Grandmother   . Hypertension Paternal Grandmother     Social History  Substance Use Topics  . Smoking status: Never Smoker   . Smokeless tobacco: None  . Alcohol Use: No    Allergies:  Allergies  Allergen Reactions  . Flagyl [Metronidazole] Hives and Rash    Prescriptions prior to admission  Medication Sig Dispense Refill Last Dose  . ondansetron (ZOFRAN) 4 MG tablet TK 1 T PO Q 8 H PRN NV  0   . Prenatal Vit-Fe Fumarate-FA (MULTIVITAMIN-PRENATAL) 27-0.8 MG TABS tablet Take 1 tablet by mouth daily at 12 noon.    01/27/2016 at Unknown time  . promethazine (PHENERGAN) 25 MG tablet Take 1 tablet (25 mg total) by mouth every 6 (six) hours as needed for nausea or vomiting. 30 tablet 0     Review of Systems  Constitutional: Negative.   Gastrointestinal: Positive for abdominal pain (mild abdominal cramping). Negative for nausea, vomiting, diarrhea and constipation.   Genitourinary: Negative for dysuria.       + vaginal bleeding   Physical Exam   Blood pressure 138/67, pulse 73, temperature 98.4 F (36.9 C), temperature source Oral, resp. rate 18, height 5\' 5"  (1.651 m), weight 153 lb 6.4 oz (69.582 kg), last menstrual period 10/25/2015.  Physical Exam  Nursing note and vitals reviewed. Constitutional: She is oriented to person, place, and time. She appears well-developed and well-nourished. No distress.  HENT:  Head: Normocephalic and atraumatic.  Eyes: Conjunctivae are normal. Right eye exhibits no discharge. Left eye exhibits no discharge. No scleral icterus.  Neck: Normal range of motion.  Cardiovascular: Normal rate, regular rhythm and normal heart sounds.   No murmur heard. Respiratory: Effort normal and breath sounds normal. No respiratory distress. She has no wheezes.  GI: Soft.  Genitourinary: Vagina normal. Cervix exhibits no motion tenderness, no discharge and no friability.  Strawberry cervix Cervix closed  Neurological: She is alert and oriented to person, place, and time.  Skin: Skin is warm and dry. She is not diaphoretic.  Psychiatric: She has a normal mood and affect. Her behavior is normal. Judgment and thought content normal.    MAU Course  Procedures Results for orders placed or performed during the hospital encounter of  02/25/16 (from the past 24 hour(s))  Urinalysis, Routine w reflex microscopic (not at [facility])     Status: Abnormal   Collection Time: 02/25/16 11:20 AM  Result Value Ref Range   Color, Urine YELLOW YELLOW   APPearance CLEAR CLEAR   Specific Gravity, Urine 1.025 1.005 - 1.030   pH 6.5 5.0 - 8.0   Glucose, UA 100 (A) NEGATIVE mg/dL   Hgb urine dipstick NEGATIVE NEGATIVE   Bilirubin Urine NEGATIVE NEGATIVE   Ketones, ur 40 (A) NEGATIVE mg/dL   Protein, ur NEGATIVE NEGATIVE mg/dL   Nitrite NEGATIVE NEGATIVE   Leukocytes, UA NEGATIVE NEGATIVE  CBC     Status: Abnormal   Collection Time: 02/25/16 12:48 PM   Result Value Ref Range   WBC 6.9 4.0 - 10.5 K/uL   RBC 3.85 (L) 3.87 - 5.11 MIL/uL   Hemoglobin 11.8 (L) 12.0 - 15.0 g/dL   HCT 33.9 (L) 36.0 - 46.0 %   MCV 88.1 78.0 - 100.0 fL   MCH 30.6 26.0 - 34.0 pg   MCHC 34.8 30.0 - 36.0 g/dL   RDW 13.7 11.5 - 15.5 %   Platelets 319 150 - 400 K/uL  ABO/Rh     Status: None (Preliminary result)   Collection Time: 02/25/16 12:48 PM  Result Value Ref Range   ABO/RH(D) O POS   Wet prep, genital     Status: Abnormal   Collection Time: 02/25/16  1:15 PM  Result Value Ref Range   Yeast Wet Prep HPF POC NONE SEEN NONE SEEN   Trich, Wet Prep NONE SEEN NONE SEEN   Clue Cells Wet Prep HPF POC NONE SEEN NONE SEEN   WBC, Wet Prep HPF POC FEW (A) NONE SEEN   Sperm NONE SEEN     MDM FHT 154 by doppler O positive Was treated for chlamydia last month. Partner was not treated. Has had intercourse with him since then.  Azithromycin 1 gm PO for chlamydia reexposure Cervix closed  Assessment and Plan  A: 1. Exposure to chlamydia   2. Vaginal bleeding in pregnancy, second trimester     P: Discharge home No intercourse x 7 days after partner has been treated GC/CT pending Discussed reasons to return Expedited partner treatment Rx & info sheet given  Jorje Guild 02/25/2016, 12:04 PM

## 2016-02-26 LAB — GC/CHLAMYDIA PROBE AMP (~~LOC~~) NOT AT ARMC
Chlamydia: NEGATIVE
NEISSERIA GONORRHEA: NEGATIVE

## 2016-06-25 ENCOUNTER — Inpatient Hospital Stay (HOSPITAL_COMMUNITY)
Admission: AD | Admit: 2016-06-25 | Discharge: 2016-06-25 | Disposition: A | Discharge status: Home / Self Care | Payer: Medicaid Other | Source: Ambulatory Visit | Attending: Family Medicine | Admitting: Family Medicine

## 2016-06-25 ENCOUNTER — Encounter (HOSPITAL_COMMUNITY): Payer: Self-pay | Admitting: *Deleted

## 2016-06-25 DIAGNOSIS — O99283 Endocrine, nutritional and metabolic diseases complicating pregnancy, third trimester: Secondary | ICD-10-CM | POA: Insufficient documentation

## 2016-06-25 DIAGNOSIS — Z3A35 35 weeks gestation of pregnancy: Secondary | ICD-10-CM | POA: Diagnosis not present

## 2016-06-25 DIAGNOSIS — R000 Tachycardia, unspecified: Secondary | ICD-10-CM

## 2016-06-25 DIAGNOSIS — O26893 Other specified pregnancy related conditions, third trimester: Secondary | ICD-10-CM | POA: Insufficient documentation

## 2016-06-25 DIAGNOSIS — Z888 Allergy status to other drugs, medicaments and biological substances status: Secondary | ICD-10-CM | POA: Insufficient documentation

## 2016-06-25 DIAGNOSIS — R112 Nausea with vomiting, unspecified: Secondary | ICD-10-CM | POA: Insufficient documentation

## 2016-06-25 DIAGNOSIS — E860 Dehydration: Secondary | ICD-10-CM | POA: Diagnosis present

## 2016-06-25 DIAGNOSIS — Z79899 Other long term (current) drug therapy: Secondary | ICD-10-CM | POA: Diagnosis not present

## 2016-06-25 LAB — URINALYSIS, ROUTINE W REFLEX MICROSCOPIC
BILIRUBIN URINE: NEGATIVE
GLUCOSE, UA: 100 mg/dL — AB
Hgb urine dipstick: NEGATIVE
Ketones, ur: >80 mg/dL — AB
NITRITE: NEGATIVE
PH: 7 (ref 5.0–8.0)
Protein, ur: NEGATIVE mg/dL
SPECIFIC GRAVITY, URINE: 1.01 (ref 1.005–1.030)

## 2016-06-25 LAB — URINE MICROSCOPIC-ADD ON

## 2016-06-25 LAB — FETAL FIBRONECTIN: Fetal Fibronectin: NEGATIVE

## 2016-06-25 MED ORDER — SODIUM CHLORIDE 0.9 % IV SOLN: INTRAVENOUS | Status: DC

## 2016-06-25 MED ORDER — HYDROMORPHONE HCL 1 MG/ML IJ SOLN: 0.5000 mg | Freq: Once | INTRAMUSCULAR | Status: DC

## 2016-06-25 MED ORDER — DEXTROSE IN LACTATED RINGERS 5 % IV SOLN
INTRAVENOUS | Status: DC
  Administered 2016-06-25: 18:00:00 via INTRAVENOUS

## 2016-06-25 MED ORDER — SODIUM CHLORIDE 0.9 % IV SOLN
Freq: Once | INTRAVENOUS | Status: AC
  Administered 2016-06-25: 17:00:00 via INTRAVENOUS

## 2016-06-25 NOTE — Discharge Instructions (Signed)
Dehydration, Adult Dehydration is a condition in which you do not have enough fluid or water in your body. It happens when you take in less fluid than you lose. Vital organs such as the kidneys, brain, and heart cannot function without a proper amount of fluids. Any loss of fluids from the body can cause dehydration.  Dehydration can range from mild to severe. This condition should be treated right away to help prevent it from becoming severe. CAUSES  This condition may be caused by:  Vomiting.  Diarrhea.  Excessive sweating, such as when exercising in hot or humid weather.  Not drinking enough fluid during strenuous exercise or during an illness.  Excessive urine output.  Fever.  Certain medicines. RISK FACTORS This condition is more likely to develop in:  People who are taking certain medicines that cause the body to lose excess fluid (diuretics).   People who have a chronic illness, such as diabetes, that may increase urination.  Older adults.   People who live at high altitudes.   People who participate in endurance sports.  SYMPTOMS  Mild Dehydration  Thirst.  Dry lips.  Slightly dry mouth.  Dry, warm skin. Moderate Dehydration  Very dry mouth.   Muscle cramps.   Dark urine and decreased urine production.   Decreased tear production.   Headache.   Light-headedness, especially when you stand up from a sitting position.  Severe Dehydration  Changes in skin.   Cold and clammy skin.   Skin does not spring back quickly when lightly pinched and released.   Changes in body fluids.   Extreme thirst.   No tears.   Not able to sweat when body temperature is high, such as in hot weather.   Minimal urine production.   Changes in vital signs.   Rapid, weak pulse (more than 100 beats per minute when you are sitting still).   Rapid breathing.   Low blood pressure.   Other changes.   Sunken eyes.   Cold hands and feet.    Confusion.  Lethargy and difficulty being awakened.  Fainting (syncope).   Short-term weight loss.   Unconsciousness. DIAGNOSIS  This condition may be diagnosed based on your symptoms. You may also have tests to determine how severe your dehydration is. These tests may include:   Urine tests.   Blood tests.  TREATMENT  Treatment for this condition depends on the severity. Mild or moderate dehydration can often be treated at home. Treatment should be started right away. Do not wait until dehydration becomes severe. Severe dehydration needs to be treated at the hospital. Treatment for Mild Dehydration  Drinking plenty of water to replace the fluid you have lost.   Replacing minerals in your blood (electrolytes) that you may have lost.  Treatment for Moderate Dehydration  Consuming oral rehydration solution (ORS). Treatment for Severe Dehydration  Receiving fluid through an IV tube.   Receiving electrolyte solution through a feeding tube that is passed through your nose and into your stomach (nasogastric tube or NG tube).  Correcting any abnormalities in electrolytes. HOME CARE INSTRUCTIONS   Drink enough fluid to keep your urine clear or pale yellow.   Drink water or fluid slowly by taking small sips. You can also try sucking on ice cubes.  Have food or beverages that contain electrolytes. Examples include bananas and sports drinks.  Take over-the-counter and prescription medicines only as told by your health care provider.   Prepare ORS according to the manufacturer's instructions. Take sips  of ORS every 5 minutes until your urine returns to normal.  If you have vomiting or diarrhea, continue to try to drink water, ORS, or both.   If you have diarrhea, avoid:   Beverages that contain caffeine.   Fruit juice.   Milk.   Carbonated soft drinks.  Do not take salt tablets. This can lead to the condition of having too much sodium in your body  (hypernatremia).  SEEK MEDICAL CARE IF:  You cannot eat or drink without vomiting.  You have had moderate diarrhea during a period of more than 24 hours.  You have a fever. SEEK IMMEDIATE MEDICAL CARE IF:   You have extreme thirst.  You have severe diarrhea.  You have not urinated in 6-8 hours, or you have urinated only a small amount of very dark urine.  You have shriveled skin.  You are dizzy, confused, or both.   This information is not intended to replace advice given to you by your health care provider. Make sure you discuss any questions you have with your health care provider.   Document Released: 11/25/2005 Document Revised: 08/16/2015 Document Reviewed: 04/12/2015 Elsevier Interactive Patient Education 2016 [employer] Morning Sickness Morning sickness is when you feel sick to your stomach (nauseous) during pregnancy. This nauseous feeling may or may not come with vomiting. It often occurs in the morning but can be a problem any time of day. Morning sickness is most common during the first trimester, but it may continue throughout pregnancy. While morning sickness is unpleasant, it is usually harmless unless you develop severe and continual vomiting (hyperemesis gravidarum). This condition requires more intense treatment.  CAUSES  The cause of morning sickness is not completely known but seems to be related to normal hormonal changes that occur in pregnancy. RISK FACTORS You are at greater risk if you:  Experienced nausea or vomiting before your pregnancy.  Had morning sickness during a previous pregnancy.  Are pregnant with more than one baby, such as twins. TREATMENT  Do not use any medicines (prescription, over-the-counter, or herbal) for morning sickness without first talking to your health care provider. Your health care provider may prescribe or recommend:  Vitamin B6 supplements.  Anti-nausea medicines.  The herbal medicine ginger. HOME CARE  INSTRUCTIONS   Only take over-the-counter or prescription medicines as directed by your health care provider.  Taking multivitamins before getting pregnant can prevent or decrease the severity of morning sickness in most women.  Eat a piece of dry toast or unsalted crackers before getting out of bed in the morning.  Eat five or six small meals a day.  Eat dry and bland foods (rice, baked potato). Foods high in carbohydrates are often helpful.  Do not drink liquids with your meals. Drink liquids between meals.  Avoid greasy, fatty, and spicy foods.  Get someone to cook for you if the smell of any food causes nausea and vomiting.  If you feel nauseous after taking prenatal vitamins, take the vitamins at night or with a snack.  Snack on protein foods (nuts, yogurt, cheese) between meals if you are hungry.  Eat unsweetened gelatins for desserts.  Wearing an acupressure wristband (worn for sea sickness) may be helpful.  Acupuncture may be helpful.  Do not smoke.  Get a humidifier to keep the air in your house free of odors.  Get plenty of fresh air. SEEK MEDICAL CARE IF:   Your home remedies are not working, and you need medicine.  You feel dizzy  or lightheaded.  You are losing weight. SEEK IMMEDIATE MEDICAL CARE IF:   You have persistent and uncontrolled nausea and vomiting.  You pass out (faint). MAKE SURE YOU:  Understand these instructions.  Will watch your condition.  Will get help right away if you are not doing well or get worse.   This information is not intended to replace advice given to you by your health care provider. Make sure you discuss any questions you have with your health care provider.   Document Released: 01/16/2007 Document Revised: 11/30/2013 Document Reviewed: 05/12/2013 Elsevier Interactive Patient Education [employer].

## 2016-06-25 NOTE — MAU Provider Note (Signed)
Chief Complaint:  Nausea   First Provider Initiated Contact with Patient 06/25/16 Melanie Lopez is a 25 y.o. G1P0 at [redacted]w[redacted]d (by 7 week [place]) who presents to maternity admissions reporting episode of dizziness, nausea and fast heart rate.  Started feeling dizzy when shopping so FOB took her home. While in car, she suddenly felt nauseated and vomited.   States this happens intermittently throughout pregnancy.  When asked if she ever felt her heart race, states she has had episodes off and on, never investigated medically.  States feels some tightening, and has had contractions "for a while" but states they told her it was normal and have never checked her cervix . She reports good fetal movement, denies LOF, vaginal bleeding, vaginal itching/burning, urinary symptoms, h/a, diarrhea, constipation or fever/chills.  She denies headache, visual changes or RUQ abdominal pain.  Emesis  This is a recurrent problem. The current episode started today. The problem occurs less than 2 times per day. The problem has been unchanged. There has been no fever. Associated symptoms include dizziness. Pertinent negatives include no abdominal pain, chest pain, chills, coughing, diarrhea, fever, headaches or myalgias. She has tried nothing for the symptoms.  Dizziness This is a recurrent problem. The current episode started today. The problem occurs intermittently. The problem has been gradually improving. Associated symptoms include vomiting. Pertinent negatives include no abdominal pain, chest pain, chills, coughing, fever, headaches, myalgias, vertigo or visual change. The symptoms are aggravated by walking and standing. She has tried nothing for the symptoms.   RN Note: Patient was riding in car when she started feeling nauseated and had the driver pull over. EMS was called and patient had HR in 150s in ambulance. A SL IV was started and patient now presents to MAU.  Past Medical History: Past Medical History   Diagnosis Date  . Anemia     Past obstetric history: OB History  Gravida Para Term Preterm AB SAB TAB Ectopic Multiple Living  1             # Outcome Date GA Lbr Len/2nd Weight Sex Delivery Anes PTL Lv  1 Current               Past Surgical History: Past Surgical History  Procedure Laterality Date  . No past surgeries      Family History: Family History  Problem Relation Age of Onset  . Anemia Mother   . Hypertension Father   . Hypertension Sister   . Anemia Sister   . Anemia Brother   . Anemia Maternal Aunt   . Anemia Maternal Grandmother   . Diabetes Paternal Grandmother   . Hypertension Paternal Grandmother     Social History: Social History  Substance Use Topics  . Smoking status: Never Smoker   . Smokeless tobacco: None  . Alcohol Use: No    Allergies:  Allergies  Allergen Reactions  . Flagyl [Metronidazole] Hives and Rash    Meds:  Prescriptions prior to admission  Medication Sig Dispense Refill Last Dose  . Prenatal Vit-Fe Fumarate-FA (MULTIVITAMIN-PRENATAL) 27-0.8 MG TABS tablet Take 1 tablet by mouth daily at 12 noon.    Past Week at Unknown time  . promethazine (PHENERGAN) 25 MG tablet Take 1 tablet (25 mg total) by mouth every 6 (six) hours as needed for nausea or vomiting. (Patient not taking: Reported on 02/25/2016) 30 tablet 0 Not Taking at Unknown time    I have reviewed patient's Past Medical Hx,  Surgical Hx, Family Hx, Social Hx, medications and allergies.   ROS:  Review of Systems  Constitutional: Negative for fever and chills.  Respiratory: Negative for cough.   Cardiovascular: Negative for chest pain.  Gastrointestinal: Positive for vomiting. Negative for abdominal pain and diarrhea.  Musculoskeletal: Negative for myalgias.  Neurological: Positive for dizziness. Negative for vertigo and headaches.   Other systems negative  Physical Exam  Patient Vitals for the past 24 hrs:  BP Temp Temp src Pulse Resp SpO2 Height Weight   06/25/16 1627 121/76 mmHg - - (!) 130 - 100 % - -  06/25/16 1614 119/71 mmHg 98.5 F (36.9 C) Oral 117 18 99 % 5\' 5"  (1.651 m) 163 lb 1.6 oz (73.982 kg)   Filed Vitals:   06/25/16 1815 06/25/16 1830 06/25/16 1845 06/25/16 1935  BP:    119/70  Pulse: 82 90 94 90  Temp:      TempSrc:      Resp:    16  Height:      Weight:      SpO2: 100% 100% 100% 100%    Constitutional: Well-developed, well-nourished female in no acute distress.  Cardiovascular: Tachycardia, rate is variable, S1S2 audible, difficult to discern murmur due to fast rate Respiratory: normal effort, clear to auscultation bilaterally GI: Abd soft, non-tender, gravid appropriate for gestational age.   No rebound or guarding. MS: Extremities nontender, no edema, normal ROM Neurologic: Alert and oriented x 4.  GU: Neg CVAT.  PELVIC EXAM: Cervix pink, visually closed, without lesion, scant white creamy discharge, vaginal walls and external genitalia normal     Dilation: Fingertip (0.5-1) Effacement (%): 80 Station: -3 Exam by:: Hansel Feinstein, CNM   FHT:  Baseline 140 , moderate variability, accelerations present, no decelerations Contractions: q 3-5 mins Irregular     Labs: Results for orders placed or performed during the hospital encounter of 06/25/16 (from the past 24 hour(s))  Urinalysis, Routine w reflex microscopic (not at [facility])     Status: Abnormal   Collection Time: 06/25/16  4:45 PM  Result Value Ref Range   Color, Urine YELLOW YELLOW   APPearance HAZY (A) CLEAR   Specific Gravity, Urine 1.010 1.005 - 1.030   pH 7.0 5.0 - 8.0   Glucose, UA 100 (A) NEGATIVE mg/dL   Hgb urine dipstick NEGATIVE NEGATIVE   Bilirubin Urine NEGATIVE NEGATIVE   Ketones, ur >80 (A) NEGATIVE mg/dL   Protein, ur NEGATIVE NEGATIVE mg/dL   Nitrite NEGATIVE NEGATIVE   Leukocytes, UA SMALL (A) NEGATIVE  Urine microscopic-add on     Status: Abnormal   Collection Time: 06/25/16  4:45 PM  Result Value Ref Range   Squamous  Epithelial / LPF 0-5 (A) NONE SEEN   WBC, UA 0-5 0 - 5 WBC/hpf   RBC / HPF 0-5 0 - 5 RBC/hpf   Bacteria, UA RARE (A) NONE SEEN  Fetal fibronectin     Status: None   Collection Time: 06/25/16  4:50 PM  Result Value Ref Range   Fetal Fibronectin NEGATIVE NEGATIVE    Imaging:  No results found.  MAU Course/MDM: I have ordered labs and reviewed results.   EKG done >>  Initial tracing showed possible prolonged QT which resolved by next tracing. Nonspecific Twave abnormality.   Sinus tach at 116-129.  EKG reviewed by Dr Baron Sane.  He states QT resolution is reassuring, and other changes are due to fast rate.  Likely due to dehydration.    Fetal fibronectin sent   >>>>>>>>>>  NEGATIVE NST reviewed Consult  Dr Baron Sane with presentation, exam findings and test results. He and the team will come evaluate patient after report  >> States tachy is likely due to dehydration.   Will hydrate with IV fluid.    After 2 liters of fluid, heart rate settled down to low 90s.  Contractions stopped.     Assessment: SIUP at [redacted]w[redacted]d  Nausea and vomiting Dehydration, improved with hydration and antiemetics Tachycardia, resolved with hydration  Plan: Discharge home Rx Phenergan tablets for PRN home use Advance diet as tolerated PTL precautions Follow [facility] as [facility] at [facility] for [facility]    Medication List    ASK your doctor about these medications        multivitamin-prenatal 27-0.8 MG Tabs tablet  Take 1 tablet by mouth daily at 12 noon.     promethazine 25 MG tablet  Commonly known as:  PHENERGAN  Take 1 tablet (25 mg total) by mouth every 6 (six) hours as needed for nausea or vomiting.       Pt stable at time of discharge.  Hansel Feinstein CNM, MSN Certified Nurse-Midwife 06/25/2016 4:38 PM

## 2016-06-25 NOTE — MAU Note (Signed)
Patient was riding in car when she started feeling nauseated and had the driver pull over.  EMS was called and patient had HR in 150s in ambulance.  A SL IV was started and patient now presents to MAU.

## 2016-07-04 LAB — OB RESULTS CONSOLE GBS: GBS: NEGATIVE

## 2016-07-29 ENCOUNTER — Inpatient Hospital Stay (HOSPITAL_COMMUNITY)
Admission: AD | Admit: 2016-07-29 | Discharge: 2016-07-29 | Disposition: A | Discharge status: Home / Self Care | Payer: Medicaid Other | Source: Ambulatory Visit | Attending: Family Medicine | Admitting: Family Medicine

## 2016-07-29 ENCOUNTER — Encounter (HOSPITAL_COMMUNITY): Payer: Self-pay

## 2016-07-29 DIAGNOSIS — Z3A39 39 weeks gestation of pregnancy: Secondary | ICD-10-CM | POA: Diagnosis not present

## 2016-07-29 DIAGNOSIS — O163 Unspecified maternal hypertension, third trimester: Secondary | ICD-10-CM | POA: Insufficient documentation

## 2016-07-29 DIAGNOSIS — IMO0001 Reserved for inherently not codable concepts without codable children: Secondary | ICD-10-CM

## 2016-07-29 DIAGNOSIS — R030 Elevated blood-pressure reading, without diagnosis of hypertension: Secondary | ICD-10-CM

## 2016-07-29 LAB — CBC
HCT: 31.9 % — ABNORMAL LOW (ref 36.0–46.0)
Hemoglobin: 11.1 g/dL — ABNORMAL LOW (ref 12.0–15.0)
MCH: 29.7 pg (ref 26.0–34.0)
MCHC: 34.8 g/dL (ref 30.0–36.0)
MCV: 85.3 fL (ref 78.0–100.0)
PLATELETS: 241 10*3/uL (ref 150–400)
RBC: 3.74 MIL/uL — AB (ref 3.87–5.11)
RDW: 14.4 % (ref 11.5–15.5)
WBC: 7.7 10*3/uL (ref 4.0–10.5)

## 2016-07-29 LAB — COMPREHENSIVE METABOLIC PANEL
ALT: 16 U/L (ref 14–54)
ANION GAP: 6 (ref 5–15)
AST: 22 U/L (ref 15–41)
Albumin: 3 g/dL — ABNORMAL LOW (ref 3.5–5.0)
Alkaline Phosphatase: 132 U/L — ABNORMAL HIGH (ref 38–126)
BUN: 8 mg/dL (ref 6–20)
CHLORIDE: 106 mmol/L (ref 101–111)
CO2: 21 mmol/L — AB (ref 22–32)
CREATININE: 0.51 mg/dL (ref 0.44–1.00)
Calcium: 8.7 mg/dL — ABNORMAL LOW (ref 8.9–10.3)
Glucose, Bld: 78 mg/dL (ref 65–99)
POTASSIUM: 3.8 mmol/L (ref 3.5–5.1)
SODIUM: 133 mmol/L — AB (ref 135–145)
Total Bilirubin: 0.5 mg/dL (ref 0.3–1.2)
Total Protein: 6.9 g/dL (ref 6.5–8.1)

## 2016-07-29 LAB — PROTEIN / CREATININE RATIO, URINE
CREATININE, URINE: 166 mg/dL
PROTEIN CREATININE RATIO: 0.2 mg/mg{creat} — AB (ref 0.00–0.15)
TOTAL PROTEIN, URINE: 34 mg/dL

## 2016-07-29 NOTE — MAU Provider Note (Signed)
History     CSN: [id number]  Arrival date and time: 07/29/16 1536   First Provider Initiated Contact with Patient 07/29/16 1704      Chief Complaint  Patient presents with  . Hypertension   HPI Melanie Lopez is a 25 year old G1 P0 at 39 weeks and 5 days with history of chronic hypertension. She is not currently on any medications. She reports she's had some elevated blood pressures throughout this pregnancy but none as high as they were today. She reports 3 blood pressures in the 140s over 80s and 150s over 80s [facility] today. Denies any headache shortness of breath nausea vomiting right upper quadrant pain or swelling. She has no other complaints.  OB History    Gravida Para Term Preterm AB Living   1         0   SAB TAB Ectopic Multiple Live Births                  Past Medical History:  Diagnosis Date  . Anemia     Past Surgical History:  Procedure Laterality Date  . NO PAST SURGERIES      Family History  Problem Relation Age of Onset  . Anemia Mother   . Hypertension Father   . Hypertension Sister   . Anemia Sister   . Anemia Brother   . Anemia Maternal Aunt   . Anemia Maternal Grandmother   . Diabetes Paternal Grandmother   . Hypertension Paternal Grandmother     Social History  Substance Use Topics  . Smoking status: Never Smoker  . Smokeless tobacco: Never Used  . Alcohol use No    Allergies:  Allergies  Allergen Reactions  . Flagyl [Metronidazole] Hives and Rash    Prescriptions Prior to Admission  Medication Sig Dispense Refill Last Dose  . Prenatal Vit-Fe Fumarate-FA (MULTIVITAMIN-PRENATAL) 27-0.8 MG TABS tablet Take 1 tablet by mouth daily at 12 noon.    07/29/2016 at Unknown time  . promethazine (PHENERGAN) 25 MG tablet Take 1 tablet (25 mg total) by mouth every 6 (six) hours as needed for nausea or vomiting. (Patient not taking: Reported on 02/25/2016) 30 tablet 0 Not Taking at Unknown time    Review of Systems   Constitutional: Negative for chills and fever.  HENT: Negative for congestion.   Eyes: Negative for blurred vision and double vision.  Respiratory: Negative for cough.   Cardiovascular: Negative for chest pain and palpitations.  Gastrointestinal: Negative for abdominal pain, heartburn, nausea and vomiting.  Genitourinary: Negative for dysuria and urgency.  Musculoskeletal: Negative for back pain and myalgias.  Skin: Negative for itching and rash.  Neurological: Negative for dizziness, loss of consciousness and headaches.  Endo/Heme/Allergies: Does not bruise/bleed easily.  Psychiatric/Behavioral: Negative for depression.   Physical Exam   Blood pressure 115/73, pulse 109, temperature 98.9 F (37.2 C), temperature source Oral, resp. rate 17, height 5\' 5"  (1.651 m), weight 181 lb (82.1 kg), last menstrual period 10/25/2015, SpO2 99 %.  Physical Exam  Constitutional: She is oriented to person, place, and time. She appears well-developed and well-nourished.  HENT:  Head: Normocephalic and atraumatic.  Cardiovascular: Normal rate and regular rhythm.  Exam reveals no gallop and no friction rub.   No murmur heard. Respiratory: Effort normal and breath sounds normal. No respiratory distress. She has no wheezes.  GI: Soft. Bowel sounds are normal. She exhibits no distension. There is no tenderness.  Musculoskeletal: Normal range of motion. She exhibits no edema  or deformity.  Neurological: She is alert and oriented to person, place, and time.  Skin: Skin is warm and dry.   NST: Reactive  MAU Course  Procedures  MDM In the MAU patient's blood pressures were not elevated at all and remained in the 110s over 80s.  She denied any progressive of symptoms that would be suggestive of preeclampsia. CBC CMP and protein creatinine ratio were all within normal limits. Assessment and Plan  Elevated blood pressure in pregnancy: Asian should follow up with her primary OB at the [facility] for blood pressure check in less than a week. Patient should be scheduled for induction of 41 weeks if she does not go into labor. If she continues to have elevated blood pressure she should be induced at 39 weeks.  Melanie Lopez 07/29/2016, 5:15 PM

## 2016-07-29 NOTE — Discharge Instructions (Signed)
Hypertension Hypertension, commonly called high blood pressure, is when the force of blood pumping through your arteries is too strong. Your arteries are the blood vessels that carry blood from your heart throughout your body. A blood pressure reading consists of a higher number over a lower number, such as 110/72. The higher number (systolic) is the pressure inside your arteries when your heart pumps. The lower number (diastolic) is the pressure inside your arteries when your heart relaxes. Ideally you want your blood pressure below 120/80. Hypertension forces your heart to work harder to pump blood. Your arteries may become narrow or stiff. Having untreated or uncontrolled hypertension can cause heart attack, stroke, kidney disease, and other problems. RISK FACTORS Some risk factors for high blood pressure are controllable. Others are not.  Risk factors you cannot control include:   Race. You may be at higher risk if you are African American.  Age. Risk increases with age.  Gender. Men are at higher risk than women before age 45 years. After age 65, women are at higher risk than men. Risk factors you can control include:  Not getting enough exercise or physical activity.  Being overweight.  Getting too much fat, sugar, calories, or salt in your diet.  Drinking too much alcohol. SIGNS AND SYMPTOMS Hypertension does not usually cause signs or symptoms. Extremely high blood pressure (hypertensive crisis) may cause headache, anxiety, shortness of breath, and nosebleed. DIAGNOSIS To check if you have hypertension, your health care provider will measure your blood pressure while you are seated, with your arm held at the level of your heart. It should be measured at least twice using the same arm. Certain conditions can cause a difference in blood pressure between your right and left arms. A blood pressure reading that is higher than normal on one occasion does not mean that you need treatment. If  it is not clear whether you have high blood pressure, you may be asked to return on a different day to have your blood pressure checked again. Or, you may be asked to monitor your blood pressure at home for 1 or more weeks. TREATMENT Treating high blood pressure includes making lifestyle changes and possibly taking medicine. Living a healthy lifestyle can help lower high blood pressure. You may need to change some of your habits. Lifestyle changes may include:  Following the DASH diet. This diet is high in fruits, vegetables, and whole grains. It is low in salt, red meat, and added sugars.  Keep your sodium intake below 2,300 mg per day.  Getting at least 30-45 minutes of aerobic exercise at least 4 times per week.  Losing weight if necessary.  Not smoking.  Limiting alcoholic beverages.  Learning ways to reduce stress. Your health care provider may prescribe medicine if lifestyle changes are not enough to get your blood pressure under control, and if one of the following is true:  You are 18-59 years of age and your systolic blood pressure is above 140.  You are 60 years of age or older, and your systolic blood pressure is above 150.  Your diastolic blood pressure is above 90.  You have diabetes, and your systolic blood pressure is over 140 or your diastolic blood pressure is over 90.  You have kidney disease and your blood pressure is above 140/90.  You have heart disease and your blood pressure is above 140/90. Your personal target blood pressure may vary depending on your medical conditions, your age, and other factors. HOME CARE INSTRUCTIONS    Have your blood pressure rechecked as directed by your health care provider.   Take medicines only as directed by your health care provider. Follow the directions carefully. Blood pressure medicines must be taken as prescribed. The medicine does not work as well when you skip doses. Skipping doses also puts you at risk for  problems.  Do not smoke.   Monitor your blood pressure at home as directed by your health care provider. SEEK MEDICAL CARE IF:   You think you are having a reaction to medicines taken.  You have recurrent headaches or feel dizzy.  You have swelling in your ankles.  You have trouble with your vision. SEEK IMMEDIATE MEDICAL CARE IF:  You develop a severe headache or confusion.  You have unusual weakness, numbness, or feel faint.  You have severe chest or abdominal pain.  You vomit repeatedly.  You have trouble breathing. MAKE SURE YOU:   Understand these instructions.  Will watch your condition.  Will get help right away if you are not doing well or get worse.   This information is not intended to replace advice given to you by your health care provider. Make sure you discuss any questions you have with your health care provider.   Document Released: 11/25/2005 Document Revised: 04/11/2015 Document Reviewed: 09/17/2013 Elsevier Interactive Patient Education 2016 Elsevier Inc[employer]  

## 2016-07-29 NOTE — MAU Note (Signed)
Urine in lab 

## 2016-07-29 NOTE — MAU Note (Signed)
Pt sent over from health department for pre-eclampsia evaluation. Pt states baby is moving normally. Pt denies headeach and blurred vision. Pt denies bleeding and leaking of fluid.

## 2016-08-02 ENCOUNTER — Telehealth (HOSPITAL_COMMUNITY): Payer: Self-pay | Admitting: *Deleted

## 2016-08-02 NOTE — Telephone Encounter (Signed)
Preadmission screen  

## 2016-08-07 ENCOUNTER — Inpatient Hospital Stay (HOSPITAL_COMMUNITY)
Admission: RE | Admit: 2016-08-07 | Discharge: 2016-08-10 | DRG: 775 | Disposition: A | Discharge status: Home / Self Care | Payer: Medicaid Other | Source: Ambulatory Visit | Attending: Obstetrics and Gynecology | Admitting: Obstetrics and Gynecology

## 2016-08-07 ENCOUNTER — Encounter (HOSPITAL_COMMUNITY): Payer: Self-pay

## 2016-08-07 DIAGNOSIS — Z8249 Family history of ischemic heart disease and other diseases of the circulatory system: Secondary | ICD-10-CM | POA: Diagnosis not present

## 2016-08-07 DIAGNOSIS — O480 Post-term pregnancy: Secondary | ICD-10-CM

## 2016-08-07 DIAGNOSIS — Z3A41 41 weeks gestation of pregnancy: Secondary | ICD-10-CM

## 2016-08-07 DIAGNOSIS — Z833 Family history of diabetes mellitus: Secondary | ICD-10-CM

## 2016-08-07 HISTORY — DX: Post-term pregnancy: O48.0

## 2016-08-07 HISTORY — DX: Essential (primary) hypertension: I10

## 2016-08-07 LAB — CBC
HEMATOCRIT: 32.9 % — AB (ref 36.0–46.0)
Hemoglobin: 11.1 g/dL — ABNORMAL LOW (ref 12.0–15.0)
MCH: 29 pg (ref 26.0–34.0)
MCHC: 33.7 g/dL (ref 30.0–36.0)
MCV: 85.9 fL (ref 78.0–100.0)
Platelets: 253 10*3/uL (ref 150–400)
RBC: 3.83 MIL/uL — ABNORMAL LOW (ref 3.87–5.11)
RDW: 14.5 % (ref 11.5–15.5)
WBC: 7.9 10*3/uL (ref 4.0–10.5)

## 2016-08-07 LAB — RPR: RPR Ser Ql: NONREACTIVE

## 2016-08-07 LAB — TYPE AND SCREEN
ABO/RH(D): O POS
Antibody Screen: NEGATIVE

## 2016-08-07 MED ORDER — OXYTOCIN 40 UNITS IN LACTATED RINGERS INFUSION - SIMPLE MED
2.5000 [IU]/h | INTRAVENOUS | Status: DC
  Filled 2016-08-07: qty 1000

## 2016-08-07 MED ORDER — LIDOCAINE HCL (PF) 1 % IJ SOLN
30.0000 mL | INTRAMUSCULAR | Status: DC | PRN
  Filled 2016-08-07: qty 30

## 2016-08-07 MED ORDER — ACETAMINOPHEN 325 MG PO TABS
650.0000 mg | ORAL_TABLET | ORAL | Status: DC | PRN
  Administered 2016-08-10: 650 mg via ORAL
  Filled 2016-08-07: qty 2

## 2016-08-07 MED ORDER — EPHEDRINE 5 MG/ML INJ
10.0000 mg | INTRAVENOUS | Status: DC | PRN
  Filled 2016-08-07: qty 4

## 2016-08-07 MED ORDER — OXYTOCIN BOLUS FROM INFUSION
500.0000 mL | Freq: Once | INTRAVENOUS | Status: AC
  Administered 2016-08-08: 500 mL via INTRAVENOUS

## 2016-08-07 MED ORDER — ONDANSETRON HCL 4 MG/2ML IJ SOLN
4.0000 mg | Freq: Four times a day (QID) | INTRAMUSCULAR | Status: DC | PRN
  Administered 2016-08-07 (×2): 4 mg via INTRAVENOUS
  Filled 2016-08-07 (×2): qty 2

## 2016-08-07 MED ORDER — LACTATED RINGERS IV SOLN: 500.0000 mL | Freq: Once | INTRAVENOUS | Status: DC

## 2016-08-07 MED ORDER — OXYTOCIN 40 UNITS IN LACTATED RINGERS INFUSION - SIMPLE MED
1.0000 m[IU]/min | INTRAVENOUS | Status: DC
  Administered 2016-08-07: 2 m[IU]/min via INTRAVENOUS

## 2016-08-07 MED ORDER — FENTANYL CITRATE (PF) 100 MCG/2ML IJ SOLN
INTRAMUSCULAR | Status: AC
  Administered 2016-08-07: 100 ug via INTRAVENOUS
  Filled 2016-08-07: qty 2

## 2016-08-07 MED ORDER — FLEET ENEMA 7-19 GM/118ML RE ENEM: 1.0000 | ENEMA | RECTAL | Status: DC | PRN

## 2016-08-07 MED ORDER — MISOPROSTOL 25 MCG QUARTER TABLET
25.0000 ug | ORAL_TABLET | ORAL | Status: DC | PRN
  Administered 2016-08-07 (×2): 25 ug via VAGINAL
  Filled 2016-08-07 (×3): qty 0.25
  Filled 2016-08-07: qty 1

## 2016-08-07 MED ORDER — OXYCODONE-ACETAMINOPHEN 5-325 MG PO TABS: 1.0000 | ORAL_TABLET | ORAL | Status: DC | PRN

## 2016-08-07 MED ORDER — FENTANYL CITRATE (PF) 100 MCG/2ML IJ SOLN
100.0000 ug | INTRAMUSCULAR | Status: DC | PRN
  Administered 2016-08-07 – 2016-08-08 (×3): 100 ug via INTRAVENOUS
  Filled 2016-08-07 (×2): qty 2

## 2016-08-07 MED ORDER — PHENYLEPHRINE 40 MCG/ML (10ML) SYRINGE FOR IV PUSH (FOR BLOOD PRESSURE SUPPORT)
80.0000 ug | PREFILLED_SYRINGE | INTRAVENOUS | Status: DC | PRN
  Filled 2016-08-07: qty 5
  Filled 2016-08-07: qty 10

## 2016-08-07 MED ORDER — OXYCODONE-ACETAMINOPHEN 5-325 MG PO TABS: 2.0000 | ORAL_TABLET | ORAL | Status: DC | PRN

## 2016-08-07 MED ORDER — DIPHENHYDRAMINE HCL 50 MG/ML IJ SOLN: 12.5000 mg | INTRAMUSCULAR | Status: DC | PRN

## 2016-08-07 MED ORDER — LACTATED RINGERS IV SOLN: 500.0000 mL | INTRAVENOUS | Status: DC | PRN

## 2016-08-07 MED ORDER — TERBUTALINE SULFATE 1 MG/ML IJ SOLN
0.2500 mg | Freq: Once | INTRAMUSCULAR | Status: DC | PRN
  Filled 2016-08-07: qty 1

## 2016-08-07 MED ORDER — SOD CITRATE-CITRIC ACID 500-334 MG/5ML PO SOLN: 30.0000 mL | ORAL | Status: DC | PRN

## 2016-08-07 MED ORDER — FENTANYL 2.5 MCG/ML BUPIVACAINE 1/10 % EPIDURAL INFUSION (WH - ANES)
14.0000 mL/h | INTRAMUSCULAR | Status: DC | PRN
  Administered 2016-08-08 (×2): 14 mL/h via EPIDURAL
  Filled 2016-08-07 (×2): qty 125

## 2016-08-07 MED ORDER — LACTATED RINGERS IV SOLN
INTRAVENOUS | Status: DC
  Administered 2016-08-07 – 2016-08-08 (×2): via INTRAVENOUS

## 2016-08-07 NOTE — Progress Notes (Cosign Needed)
LABOR PROGRESS NOTE  Melanie Lopez is a 25 y.o. G1P0 at [redacted]w[redacted]d  admitted for IOL for postdates.  Subjective: Doing well. Comfortable through contractions. No complaints.  Objective: BP 124/73   Pulse 73   Temp 98.2 F (36.8 C) (Oral)   Resp 20   Ht 5\' 5"  (1.651 m)   Wt 82.1 kg (181 lb)   LMP 10/25/2015   BMI 30.12 kg/m  or  Vitals:   08/07/16 1950 08/07/16 2001 08/07/16 2030 08/07/16 2101  BP: 133/72 133/74 134/75 124/73  Pulse: 75 75 76 73  Resp: 18 20    Temp: 98.2 F (36.8 C)     TempSrc: Oral     Weight:      Height:        Dilation: 5 Effacement (%): 80 Cervical Position: Middle Station: -1 Presentation: Vertex Exam by:: Dr. Vanetta Shawl FHT: 150 baseline, moderate variability, + accelerations, no decelerations Uterine activity: contractions q3 minutes  Labs: Lab Results  Component Value Date   WBC 7.9 08/07/2016   HGB 11.1 (L) 08/07/2016   HCT 32.9 (L) 08/07/2016   MCV 85.9 08/07/2016   PLT 253 08/07/2016    Patient Active Problem List   Diagnosis Date Noted  . Post-dates pregnancy 08/07/2016    Assessment / Plan: 25 y.o. G1P0 at [redacted]w[redacted]d here for IOL 2/2 post dates.  Labor: pitocin (4 units currently) Fetal Wellbeing: Category I Pain Control:  Undecided (considering epidural depending on progression) Anticipated MOD:  SVD  Lorenza Evangelist, Medical Student  8/30/20179:26 PM

## 2016-08-07 NOTE — Progress Notes (Signed)
LABOR PROGRESS NOTE  Bethania Shone is a 25 y.o. G1P0 at [redacted]w[redacted]d  admitted for IOL for postdates  Subjective: Doing well. No complaints.  Objective: BP 125/74   Pulse 68   Temp 98.6 F (37 C) (Oral)   Resp 18   Ht 5\' 5"  (1.651 m)   Wt 82.1 kg (181 lb)   LMP 10/25/2015   BMI 30.12 kg/m  or  Vitals:   08/07/16 1045 08/07/16 1101 08/07/16 1201 08/07/16 1344  BP:      Pulse:      Resp: 18 18 16 18   Temp:    98.6 F (37 C)  TempSrc:    Oral  Weight:      Height:        Dilation: 1 Effacement (%): 50 Cervical Position: Middle Station: -2 Presentation: Vertex Exam by:: Dr Vanetta Shawl FHT: 140 baseline, moderate variability, + accelerations, no decelerations Uterine activity: q1-43min  Labs: Lab Results  Component Value Date   WBC 7.9 08/07/2016   HGB 11.1 (L) 08/07/2016   HCT 32.9 (L) 08/07/2016   MCV 85.9 08/07/2016   PLT 253 08/07/2016    Patient Active Problem List   Diagnosis Date Noted  . Post-dates pregnancy 08/07/2016    Assessment / Plan: 25 y.o. G1P0 at [redacted]w[redacted]d here for IOL for postdates   Labor: Foley bulb placed and 2nd cytotec placed, supervised by Dr Vanetta Shawl Fetal Wellbeing:  Category I Pain Control:  epidural Anticipated MOD:  SVD  Bufford Lope, DO PGY-1 8/30/20171:57 PM

## 2016-08-07 NOTE — H&P (Signed)
LABOR AND DELIVERY ADMISSION HISTORY AND PHYSICAL NOTE  Melanie Lopez is a 25 y.o. female G53P0 with IUP at [redacted]w[redacted]d by 8wk U/S presenting for IOL for postdates.   She reports positive fetal movement. She denies leakage of fluid or vaginal bleeding. Not feeling ctx.  Prenatal History/Complications: none  Past Medical History: Past Medical History:  Diagnosis Date  . Anemia   . Hypertension     Past Surgical History: Past Surgical History:  Procedure Laterality Date  . NO PAST SURGERIES      Obstetrical History: OB History    Gravida Para Term Preterm AB Living   1         0   SAB TAB Ectopic Multiple Live Births                  Social History: Social History   Social History  . Marital status: Single    Spouse name: N/A  . Number of children: N/A  . Years of education: N/A   Social History Main Topics  . Smoking status: Never Smoker  . Smokeless tobacco: Never Used  . Alcohol use No  . Drug use: No  . Sexual activity: Yes    Birth control/ protection: Pill   Other Topics Concern  . None   Social History Narrative  . None    Family History: Family History  Problem Relation Age of Onset  . Anemia Mother   . Hypertension Father   . Hypertension Sister   . Anemia Sister   . Anemia Brother   . Anemia Maternal Aunt   . Anemia Maternal Grandmother   . Diabetes Paternal Grandmother   . Hypertension Paternal Grandmother     Allergies: Allergies  Allergen Reactions  . Flagyl [Metronidazole] Hives and Rash    Prescriptions Prior to Admission  Medication Sig Dispense Refill Last Dose  . Prenatal Vit-Fe Fumarate-FA (MULTIVITAMIN-PRENATAL) 27-0.8 MG TABS tablet Take 1 tablet by mouth daily at 12 noon.    08/07/2016  . promethazine (PHENERGAN) 25 MG tablet Take 1 tablet (25 mg total) by mouth every 6 (six) hours as needed for nausea or vomiting. 30 tablet 0 08/07/2016     Review of Systems   All systems reviewed and negative except as stated in  HPI  Height 5\' 5"  (1.651 m), weight 82.1 kg (181 lb), last menstrual period 10/25/2015. General appearance: alert, cooperative and no distress Lungs: no respiratory distress Heart: regular rate Abdomen: soft, non-tender Extremities: No calf swelling or tenderness Presentation: cephalic by cervical exam Fetal monitoring: 145 baseline, moderate variability, +accelerations, no decelerations Uterine activity: q6-58min     Prenatal labs: ABO, Rh: --/--/O POS (03/19 1248) Antibody:  Neg Rubella: immune RPR:   Neg HBsAg:   Neg HIV:   nonreactive GBS: Negative (07/27 0000)  Genetic screening:  neg Anatomy US: normal  Prenatal Transfer Tool  Maternal Diabetes: No Genetic Screening: Normal Maternal Ultrasounds/Referrals: Normal Fetal Ultrasounds or other Referrals:  None Maternal Substance Abuse:  No Significant Maternal Medications:  None Significant Maternal Lab Results: Lab values include: Group B Strep negative  Results for orders placed or performed during the hospital encounter of 08/07/16 (from the past 24 hour(s))  CBC   Collection Time: 08/07/16  8:05 AM  Result Value Ref Range   WBC 7.9 4.0 - 10.5 K/uL   RBC 3.83 (L) 3.87 - 5.11 MIL/uL   Hemoglobin 11.1 (L) 12.0 - 15.0 g/dL   HCT 32.9 (L) 36.0 - 46.0 %  MCV 85.9 78.0 - 100.0 fL   MCH 29.0 26.0 - 34.0 pg   MCHC 33.7 30.0 - 36.0 g/dL   RDW 14.5 11.5 - 15.5 %   Platelets 253 150 - 400 K/uL    Patient Active Problem List   Diagnosis Date Noted  . Post-dates pregnancy 08/07/2016    Assessment: Melanie Lopez is a 25 y.o. G1P0 at [redacted]w[redacted]d here for IOL for postdates  #Labor:cytotec #Pain: epidural #FWB: Category I #ID:  GBS neg #MOF: breast and bottle #MOC:POPs #Circ:  outpatient  Bufford Lope, DO PGY-1 8/30/20179:07 AM   OB FELLOW HISTORY AND PHYSICAL ATTESTATION  I have seen and examined this patient; I agree with above documentation in the resident's note.    Katherine Basset, DO OB  Fellow 08/07/2016, 12:39 PM

## 2016-08-07 NOTE — Progress Notes (Signed)
Patient ID: Melanie Lopez, female   DOB: [birth year], 25 y.o.   MRN: [id number] Pt resting in bed, rates contractions at 4/10 SVE: 5/80/-1 IUPC placed Pitocin @ 57ml/hr EFM: 135, mod variability, +accel, -decel Plan: NSVD  I have seen and examined this patient and I agree with the above. Serita Grammes CNM 12:02 AM 08/10/2016

## 2016-08-07 NOTE — Progress Notes (Signed)
LABOR PROGRESS NOTE  Melanie Lopez is a 25 y.o. G1P0 at [redacted]w[redacted]d  admitted for IOL for postdates  Subjective: Doing well. No complaints.  Objective: BP 134/87   Pulse 87   Temp 98.1 F (36.7 C) (Oral)   Resp 18   Ht 5\' 5"  (1.651 m)   Wt 82.1 kg (181 lb)   LMP 10/25/2015   BMI 30.12 kg/m  or  Vitals:   08/07/16 1558 08/07/16 1844 08/07/16 1846 08/07/16 1901  BP: 128/67  135/75 134/87  Pulse: 72  84 87  Resp: 18 18    Temp:  98.1 F (36.7 C)    TempSrc:  Oral    Weight:      Height:        Dilation: 5 Effacement (%): 80 Cervical Position: Middle Station: -1 Presentation: Vertex Exam by:: Mcdaniel, Lauren (dr. Tasia Catchings) FHT: 155 baseline, mod variability, + accelerations, no decelerations Uterine activity: q2-38min  Labs: Lab Results  Component Value Date   WBC 7.9 08/07/2016   HGB 11.1 (L) 08/07/2016   HCT 32.9 (L) 08/07/2016   MCV 85.9 08/07/2016   PLT 253 08/07/2016    Patient Active Problem List   Diagnosis Date Noted  . Post-dates pregnancy 08/07/2016    Assessment / Plan: 25 y.o. G1P0 at [redacted]w[redacted]d here for IOL for postdates. Now AROM'd to clear.  Labor: pit Fetal Wellbeing:  Category I Pain Control:  Planning on epidural Anticipated MOD:  SVD  Supervised by Dr. Lolita Rieger, DO PGY-1 8/30/20177:48 PM

## 2016-08-07 NOTE — Anesthesia Pain Management Evaluation Note (Signed)
  CRNA Pain Management Visit Note  Patient: Melanie Lopez, 25 y.o., female  "Hello I am a member of the anesthesia team at [facility]. We have an anesthesia team available at all times to provide care throughout the hospital, including epidural management and anesthesia for C-section. I don't know your plan for the delivery whether it a natural birth, water birth, IV sedation, nitrous supplementation, doula or epidural, but we want to meet your pain goals."   1.Was your pain managed to your expectations on prior hospitalizations?   No prior hospitalizations  2.What is your expectation for pain management during this hospitalization?     Epidural and IV pain meds  3.How can we help you reach that goal? Comfort measures and support  Record the patient's initial score and the patient's pain goal.   Pain: 0  Pain Goal: 6 [facility] wants you to be able to say your pain was always managed very well.  [facility] 08/07/2016

## 2016-08-08 ENCOUNTER — Inpatient Hospital Stay (HOSPITAL_COMMUNITY): Payer: Medicaid Other | Admitting: Anesthesiology

## 2016-08-08 ENCOUNTER — Encounter (HOSPITAL_COMMUNITY): Payer: Self-pay

## 2016-08-08 DIAGNOSIS — Z3A41 41 weeks gestation of pregnancy: Secondary | ICD-10-CM

## 2016-08-08 DIAGNOSIS — O480 Post-term pregnancy: Secondary | ICD-10-CM

## 2016-08-08 MED ORDER — BUPIVACAINE HCL (PF) 0.25 % IJ SOLN
INTRAMUSCULAR | Status: DC | PRN
  Administered 2016-08-08: 7 mL via EPIDURAL

## 2016-08-08 MED ORDER — ONDANSETRON HCL 4 MG/2ML IJ SOLN: 4.0000 mg | INTRAMUSCULAR | Status: DC | PRN

## 2016-08-08 MED ORDER — SIMETHICONE 80 MG PO CHEW: 80.0000 mg | CHEWABLE_TABLET | ORAL | Status: DC | PRN

## 2016-08-08 MED ORDER — LACTATED RINGERS IV SOLN: INTRAVENOUS | Status: DC

## 2016-08-08 MED ORDER — ONDANSETRON HCL 4 MG PO TABS: 4.0000 mg | ORAL_TABLET | ORAL | Status: DC | PRN

## 2016-08-08 MED ORDER — ZOLPIDEM TARTRATE 5 MG PO TABS: 5.0000 mg | ORAL_TABLET | Freq: Every evening | ORAL | Status: DC | PRN

## 2016-08-08 MED ORDER — TETANUS-DIPHTH-ACELL PERTUSSIS 5-2.5-18.5 LF-MCG/0.5 IM SUSP: 0.5000 mL | Freq: Once | INTRAMUSCULAR | Status: DC

## 2016-08-08 MED ORDER — PRENATAL MULTIVITAMIN CH
1.0000 | ORAL_TABLET | Freq: Every day | ORAL | Status: DC
  Administered 2016-08-09 – 2016-08-10 (×2): 1 via ORAL
  Filled 2016-08-08 (×2): qty 1

## 2016-08-08 MED ORDER — ACETAMINOPHEN 325 MG PO TABS: 650.0000 mg | ORAL_TABLET | ORAL | Status: DC | PRN

## 2016-08-08 MED ORDER — SENNOSIDES-DOCUSATE SODIUM 8.6-50 MG PO TABS
2.0000 | ORAL_TABLET | ORAL | Status: DC
  Administered 2016-08-08 – 2016-08-09 (×2): 2 via ORAL
  Filled 2016-08-08 (×2): qty 2

## 2016-08-08 MED ORDER — LIDOCAINE HCL (PF) 1 % IJ SOLN
INTRAMUSCULAR | Status: DC | PRN
  Administered 2016-08-08: 4 mL via EPIDURAL
  Administered 2016-08-08: 5 mL
  Administered 2016-08-08: 6 mL via EPIDURAL
  Administered 2016-08-08: 5 mL

## 2016-08-08 MED ORDER — IBUPROFEN 600 MG PO TABS
600.0000 mg | ORAL_TABLET | Freq: Four times a day (QID) | ORAL | Status: DC
  Administered 2016-08-08 – 2016-08-10 (×7): 600 mg via ORAL
  Filled 2016-08-08 (×8): qty 1

## 2016-08-08 MED ORDER — DIPHENHYDRAMINE HCL 25 MG PO CAPS: 25.0000 mg | ORAL_CAPSULE | Freq: Four times a day (QID) | ORAL | Status: DC | PRN

## 2016-08-08 MED ORDER — BENZOCAINE-MENTHOL 20-0.5 % EX AERO: 1.0000 "application " | INHALATION_SPRAY | CUTANEOUS | Status: DC | PRN

## 2016-08-08 MED ORDER — DIBUCAINE 1 % RE OINT: 1.0000 "application " | TOPICAL_OINTMENT | RECTAL | Status: DC | PRN

## 2016-08-08 MED ORDER — WITCH HAZEL-GLYCERIN EX PADS: 1.0000 "application " | MEDICATED_PAD | CUTANEOUS | Status: DC | PRN

## 2016-08-08 MED ORDER — COCONUT OIL OIL: 1.0000 "application " | TOPICAL_OIL | Status: DC | PRN

## 2016-08-08 NOTE — Anesthesia Procedure Notes (Signed)
Epidural Patient location during procedure: OB  Staffing Anesthesiologist: Falan Hensler Performed: anesthesiologist   Preanesthetic Checklist Completed: patient identified, site marked, surgical consent, pre-op evaluation, timeout performed, IV checked, risks and benefits discussed and monitors and equipment checked  Epidural Patient position: sitting Prep: DuraPrep Patient monitoring: heart rate, continuous pulse ox and blood pressure Approach: right paramedian Location: L3-L4 Injection technique: LOR saline  Needle:  Needle type: Tuohy  Needle gauge: 17 G Needle length: 9 cm and 9 Needle insertion depth: 6 cm Catheter type: closed end flexible Catheter size: 20 Guage Catheter at skin depth: 10 cm Test dose: negative  Assessment Events: blood not aspirated, injection not painful, no injection resistance, negative IV test and no paresthesia  Additional Notes Patient identified. Risks/Benefits/Options discussed with patient including but not limited to bleeding, infection, nerve damage, paralysis, failed block, incomplete pain control, headache, blood pressure changes, nausea, vomiting, reactions to medication both or allergic, itching and postpartum back pain. Confirmed with bedside nurse the patient's most recent platelet count. Confirmed with patient that they are not currently taking any anticoagulation, have any bleeding history or any family history of bleeding disorders. Patient expressed understanding and wished to proceed. All questions were answered. Sterile technique was used throughout the entire procedure. Please see nursing notes for vital signs. Test dose was given through epidural needle and negative prior to continuing to dose epidural or start infusion. Warning signs of high block given to the patient including shortness of breath, tingling/numbness in hands, complete motor block, or any concerning symptoms with instructions to call for help. Patient was given  instructions on fall risk and not to get out of bed. All questions and concerns addressed with instructions to call with any issues.     

## 2016-08-08 NOTE — Anesthesia Preprocedure Evaluation (Signed)
Anesthesia Evaluation  Patient identified by MRN, date of birth, ID band Patient awake    Reviewed: Allergy & Precautions, H&P , NPO status , Patient's Chart, lab work & pertinent test results  History of Anesthesia Complications Negative for: history of anesthetic complications  Airway Mallampati: II  TM Distance: >3 FB Neck ROM: full    Dental no notable dental hx. (+) Teeth Intact   Pulmonary neg pulmonary ROS,    Pulmonary exam normal breath sounds clear to auscultation       Cardiovascular hypertension, negative cardio ROS Normal cardiovascular exam Rhythm:regular Rate:Normal     Neuro/Psych negative neurological ROS  negative psych ROS   GI/Hepatic negative GI ROS, Neg liver ROS,   Endo/Other  negative endocrine ROS  Renal/GU negative Renal ROS  negative genitourinary   Musculoskeletal   Abdominal   Peds  Hematology negative hematology ROS (+)   Anesthesia Other Findings   Reproductive/Obstetrics (+) Pregnancy                             Anesthesia Physical Anesthesia Plan  ASA: II  Anesthesia Plan: Epidural   Post-op Pain Management:    Induction:   Airway Management Planned:   Additional Equipment:   Intra-op Plan:   Post-operative Plan:   Informed Consent: I have reviewed the patients History and Physical, chart, labs and discussed the procedure including the risks, benefits and alternatives for the proposed anesthesia with the patient or authorized representative who has indicated his/her understanding and acceptance.     Plan Discussed with:   Anesthesia Plan Comments:         Anesthesia Quick Evaluation

## 2016-08-08 NOTE — Progress Notes (Signed)
Patient ID: Melanie Lopez, female   DOB: 1990/12/21, 25 y.o.   MRN: [id number] Pt uncomfortable with contractions, Req epidural SVE: 7/80/-1 Pitocin @ 11mu/hr, MVA 180-200 Plan: pt to get epidural, increase pitocin as needed, plan NSVD  I have seen and examined this patient and I agree with the above. Serita Grammes CNM 12:02 AM 08/10/2016

## 2016-08-08 NOTE — Anesthesia Procedure Notes (Signed)
Epidural Patient location during procedure: OB Start time: 08/08/2016 7:48 AM End time: 08/08/2016 8:07 AM  Staffing Anesthesiologist: Annye Asa Performed: anesthesiologist   Preanesthetic Checklist Completed: patient identified, surgical consent, pre-op evaluation, timeout performed, IV checked, risks and benefits discussed and monitors and equipment checked  Epidural Patient position: sitting Prep: site prepped and draped and DuraPrep Patient monitoring: blood pressure, continuous pulse ox and heart rate Approach: midline Location: L2-L3 Injection technique: LOR air  Needle:  Needle type: Tuohy  Needle gauge: 17 G Needle length: 9 cm Needle insertion depth: 5.5 cm Catheter type: closed end flexible Catheter size: 19 Gauge Catheter at skin depth: 11 cm Test dose: negative (1% lidocaine)  Assessment Events: blood not aspirated, injection not painful, no injection resistance and no paresthesia  Additional Notes Pt identified in Labor room.  Monitors applied. Working IV access confirmed. Existing Epidural cath removed, tip intact. Sterile prep, drape lumbar spine.  1% lido local L 2,3.  #17ga Touhy LOR air at 5.5 cm L 2,3, cath in easily to 11 cm skin. Test dose OK, cath dosed and infusion begun.  Patient asymptomatic, VSS, no heme aspirated, tolerated well.  Jenita Seashore, MDReason for block:procedure for pain

## 2016-08-08 NOTE — Lactation Note (Signed)
This note was copied from a baby's chart. Lactation Consultation Note Initial visit at 8 hours of age.  Mom reports wanting to give formula in bottles and try breastfeeding.  Mom is unsure if she wants to attempt breastfeeding again tonight or tomorrow.  LC discussed basics with mom.  LC offered to assist with hand expression and mom declines but reports she saw some leaking earlier.  Mom reports +changes in breast, but LC did not assess at this visit.  Follow up PRN. [facility] LC resources given and discussed.  Encouraged to feed with early cues on demand.  Early newborn behavior discussed.   Mom to call for assist as needed.    Patient Name: Melanie Lopez [id number] Date: 08/08/2016 Reason for consult: Initial assessment   Maternal Data Has patient been taught Hand Expression?: Yes  Feeding Feeding Type: Bottle Fed - Formula Nipple Type: Slow - flow  LATCH Score/Interventions                Intervention(s): Breastfeeding basics reviewed     Lactation Tools Discussed/Used WIC Program: Yes   Consult Status Consult Status: Follow-up Date: 08/09/16 Follow-up type: In-patient    Justice Britain 08/08/2016, 10:46 PM

## 2016-08-09 LAB — CBC
HEMATOCRIT: 30.8 % — AB (ref 36.0–46.0)
Hemoglobin: 10.3 g/dL — ABNORMAL LOW (ref 12.0–15.0)
MCH: 28.7 pg (ref 26.0–34.0)
MCHC: 33.4 g/dL (ref 30.0–36.0)
MCV: 85.8 fL (ref 78.0–100.0)
PLATELETS: 237 10*3/uL (ref 150–400)
RBC: 3.59 MIL/uL — AB (ref 3.87–5.11)
RDW: 14.6 % (ref 11.5–15.5)
WBC: 17.5 10*3/uL — ABNORMAL HIGH (ref 4.0–10.5)

## 2016-08-09 NOTE — Anesthesia Postprocedure Evaluation (Signed)
Anesthesia Post Note  Patient: Melanie Lopez  Procedure(s) Performed: * No procedures listed *  Patient location during evaluation: Mother Baby Anesthesia Type: Epidural Level of consciousness: awake Pain management: pain level controlled Vital Signs Assessment: post-procedure vital signs reviewed and stable Respiratory status: spontaneous breathing Cardiovascular status: stable Postop Assessment: no headache, no backache, epidural receding and patient able to bend at knees Anesthetic complications: no     Last Vitals:  Vitals:   08/08/16 2115 08/09/16 0506  BP: 114/79 (!) 122/55  Pulse: (!) 117 73  Resp: 18 18  Temp: 37.6 C 36.9 C    Last Pain:  Vitals:   08/09/16 0506  TempSrc: Oral  PainSc: 0-No pain   Pain Goal: Patients Stated Pain Goal: 10 (08/07/16 2101)               Everette Rank

## 2016-08-09 NOTE — Progress Notes (Signed)
Post Partum Day 1 Subjective: up ad lib, voiding and tolerating PO, C/o intermittent tingling and pain in upper left leg after epidural, States didn't have a lot of relief from epidural on that side. Discomfort relieved with motrin.  Objective: Blood pressure (!) 122/55, pulse 73, temperature 98.4 F (36.9 C), temperature source Oral, resp. rate 18, height 5\' 5"  (1.651 m), weight 82.1 kg (181 lb), last menstrual period 10/25/2015, SpO2 98 %, unknown if currently breastfeeding.  Physical Exam:  General: alert, cooperative, appears stated age and no distress Lochia: appropriate Uterine Fundus: firm DVT Evaluation: No evidence of DVT seen on physical exam.   Recent Labs  08/07/16 0805 08/09/16 0526  HGB 11.1* 10.3*  HCT 32.9* 30.8*    Assessment/Plan: Plan for discharge tomorrow and Contraception POP   LOS: 2 days   Stacie Diette 08/09/2016, 6:56 AM  I have seen and examined this patient and agree the above assessment.  Respiratory effort normal, lochia appropriate, legs negative,  pain level normal.  CRESENZO-DISHMAN,Ambree Itxel Wickard 08/09/2016 8:06 AM

## 2016-08-10 MED ORDER — NORETHINDRONE 0.35 MG PO TABS: 1.0000 | ORAL_TABLET | Freq: Every day | ORAL | 11 refills | Status: DC

## 2016-08-10 MED ORDER — IBUPROFEN 600 MG PO TABS: 600.0000 mg | ORAL_TABLET | Freq: Four times a day (QID) | ORAL | 0 refills | Status: DC | PRN

## 2016-08-10 NOTE — Discharge Summary (Signed)
OB Discharge Summary     Patient Name: Melanie Lopez DOB: [birth year] MRN: [id number]  Date of admission: 08/07/2016 Delivering MD: Nicolette Bang   Date of discharge: 08/10/2016  Admitting diagnosis: 41wks, induction Intrauterine pregnancy: [redacted]w[redacted]d     Secondary diagnosis:  Active Problems:   Post-dates pregnancy  Additional problems: none     Discharge diagnosis: Term Pregnancy Delivered                                                                          NSVD                     Post partum procedures:none  Augmentation: Pitocin and Cytotec  Complications: None  No concerns. Ambulatory withoout orthostatic sx. Plans outpatient circ. Desires oral contraceptives and aware of increased effectiveness of LARC.   Hospital course:  Induction of Labor With Vaginal Delivery   25 y.o. yo G1P1001 at [redacted]w[redacted]d was admitted to the hospital 08/07/2016 for induction of labor.  Indication for induction: Postdates.  Patient had an uncomplicated labor course as follows: Membrane Rupture Time/Date: 7:44 PM ,08/07/2016   Intrapartum Procedures: Episiotomy: None [1]                                         Lacerations:  None [1]  Patient had delivery of a Viable infant.  Information for the patient's newborn:  Malai, Murrah [id number]  Delivery Method: Vaginal, Spontaneous Delivery (Filed from Delivery Summary)   08/08/2016  Details of delivery can be found in separate delivery note.  Patient had a routine postpartum course. Patient is discharged home 08/10/16.   Physical exam Vitals:   08/08/16 2115 08/09/16 0506 08/09/16 1731 08/10/16 0540  BP: 114/79 (!) 122/55 121/70 (!) 122/56  Pulse: (!) 117 73 72 68  Resp: 18 18 18 16   Temp: 99.7 F (37.6 C) 98.4 F (36.9 C) 97.8 F (36.6 C) 98.1 F (36.7 C)  TempSrc: Oral Oral Oral Oral  SpO2: 98%     Weight:      Height:       General: alert, cooperative and no distress Lochia: appropriate Uterine Fundus:  firm Incision: N/A DVT Evaluation: No evidence of DVT seen on physical exam. Labs: Lab Results  Component Value Date   WBC 17.5 (H) 08/09/2016   HGB 10.3 (L) 08/09/2016   HCT 30.8 (L) 08/09/2016   MCV 85.8 08/09/2016   PLT 237 08/09/2016   CMP Latest Ref Rng & Units 07/29/2016  Glucose 65 - 99 mg/dL 78  BUN 6 - 20 mg/dL 8  Creatinine 0.44 - 1.00 mg/dL 0.51  Sodium 135 - 145 mmol/L 133(L)  Potassium 3.5 - 5.1 mmol/L 3.8  Chloride 101 - 111 mmol/L 106  CO2 22 - 32 mmol/L 21(L)  Calcium 8.9 - 10.3 mg/dL 8.7(L)  Total Protein 6.5 - 8.1 g/dL 6.9  Total Bilirubin 0.3 - 1.2 mg/dL 0.5  Alkaline Phos 38 - 126 U/L 132(H)  AST 15 - 41 U/L 22  ALT 14 - 54 U/L 16    Discharge instruction: per After Visit Summary and "Baby  and Me Booklet".  After visit meds:    Medication List    TAKE these medications   ibuprofen 600 MG tablet Commonly known as:  ADVIL,MOTRIN Take 1 tablet (600 mg total) by mouth every 6 (six) hours as needed.   multivitamin-prenatal 27-0.8 MG Tabs tablet Take 1 tablet by mouth daily at 12 noon.   norethindrone 0.35 MG tablet Commonly known as:  MICRONOR,CAMILA,ERRIN Take 1 tablet (0.35 mg total) by mouth daily.       Diet: routine diet  Activity: Advance as tolerated. Pelvic rest for 6 weeks.   Outpatient follow up:6 weeks Follow up Appt:No future appointments. Follow up Visit:No Follow-up on file.  Postpartum contraception: Progesterone only pills  Newborn Data: Live born female  Birth Weight: 6 lb 15.1 oz (3150 g) APGAR: 7, 8  Baby Feeding: breast and bottle Disposition:home with mother   08/10/2016 [name], CNM

## 2016-08-10 NOTE — Lactation Note (Signed)
This note was copied from a baby's chart. Lactation Consultation Note  Patient Name: [name] [id number] Date: 08/10/2016 Reason for consult: Follow-up assessment    With this mom of a term baby, now 37 hours old. Mom is formula feeding, but when offered a hand pump, said she will pump and bottle feed [name]. Review of manual puimp use done. Mom denied any questions/concerns at this time. Brief review of how to suppress milk/breast care, if mom decides to do this. Mom knows to call lactation as needed.  Maternal Data    Feeding Feeding Type: Bottle Fed - Formula Nipple Type: Slow - flow  LATCH Score/Interventions                      Lactation Tools Discussed/Used     Consult Status Consult Status: Complete Follow-up type: Call as needed    Tonna Corner 08/10/2016, 11:50 AM

## 2016-08-10 NOTE — Discharge Instructions (Signed)

## 2016-08-11 ENCOUNTER — Ambulatory Visit: Payer: Self-pay

## 2016-08-11 NOTE — Lactation Note (Signed)
This note was copied from a baby's chart. Lactation Consultation Note  Patient Name: Melanie Lopez [id number] Date: 08/11/2016 Reason for consult: Follow-up assessment with this mom of a term baby, now 29 hours. Mom does not want to breast feed, so I reviewed with mom how to decrease her supply. I gave mom ice packs - she is getting engorged, and also advised cabbage leaves.   Maternal Data    Feeding    LATCH Score/Interventions                      Lactation Tools Discussed/Used     Consult Status Consult Status: Complete    Tonna Corner 08/11/2016, 8:28 AM

## 2016-08-30 IMAGING — US US OB COMP LESS 14 WK
1 series · 14 of 28 positions shown · non-contrast
Comparison: Ultrasound dated 12/14/2015

CLINICAL DATA: 24-year-old pregnant female with abdominal pain,
nausea and vomiting

EXAM:
OBSTETRIC <14 WK US AND TRANSVAGINAL OB US
TECHNIQUE: Both transabdominal and transvaginal ultrasound examinations were
performed for complete evaluation of the gestation as well as the
maternal uterus, adnexal regions, and pelvic cul-de-sac.
Transvaginal technique was performed to assess early pregnancy.

[Series 1: us ob comp less 14 wk · 0.23mm/px · 49 acquisitions, 14 frames shown]
[im 2/49]
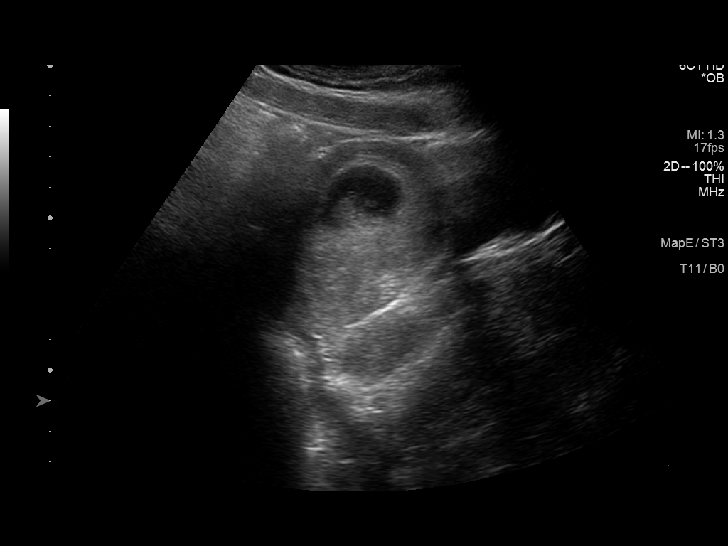
[im 6/49]
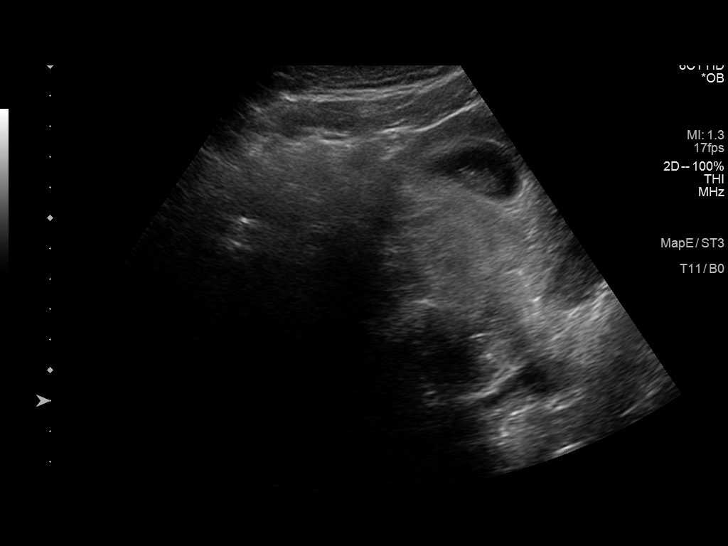
[im 9/49]
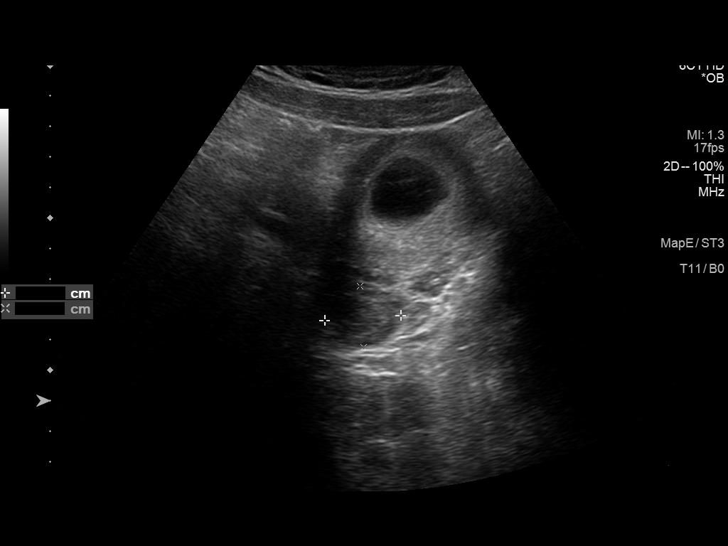
[im 13/49]
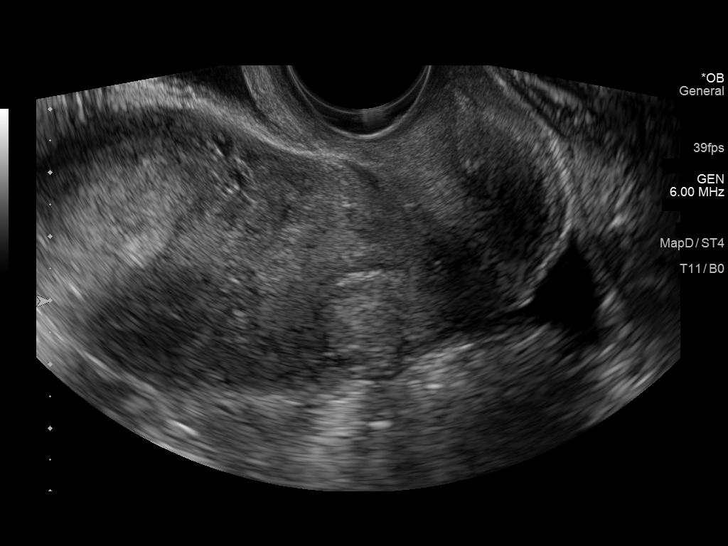
[im 17/49]
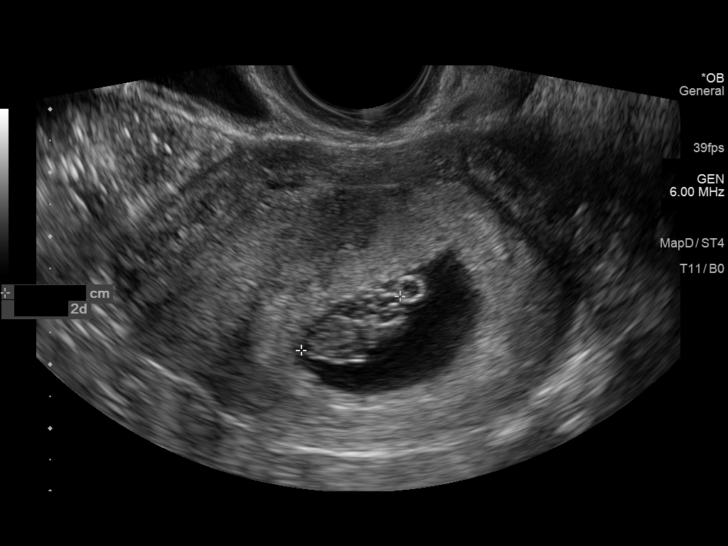
[im 20/49]
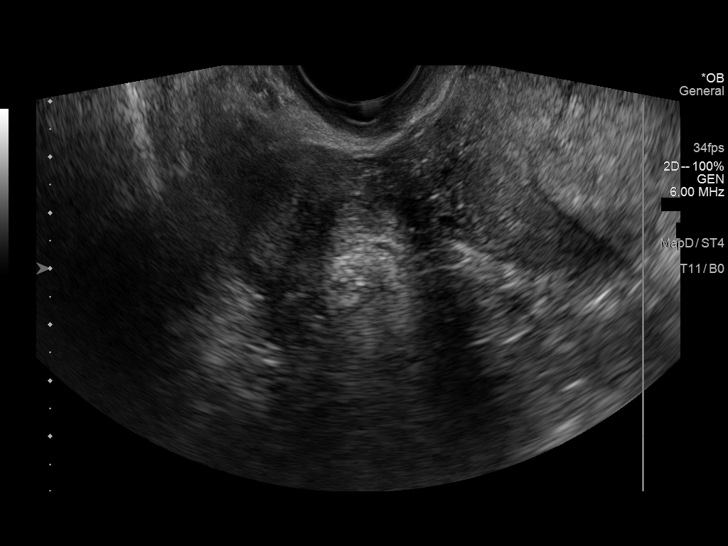
[im 24/49]
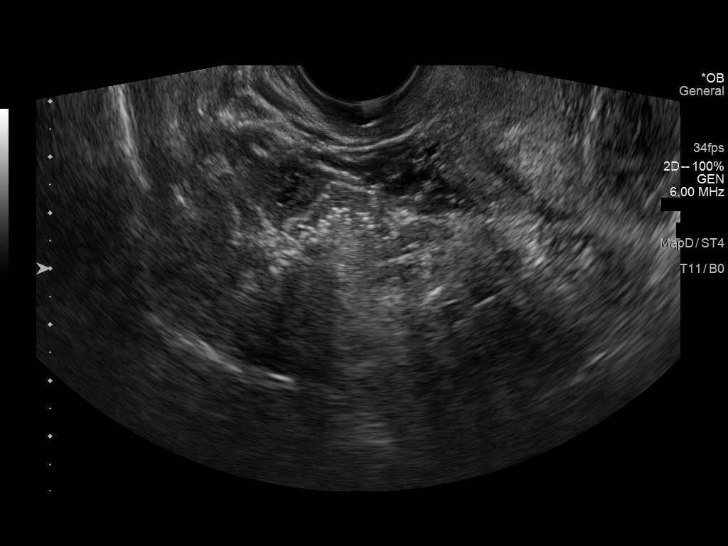
[im 27/49]
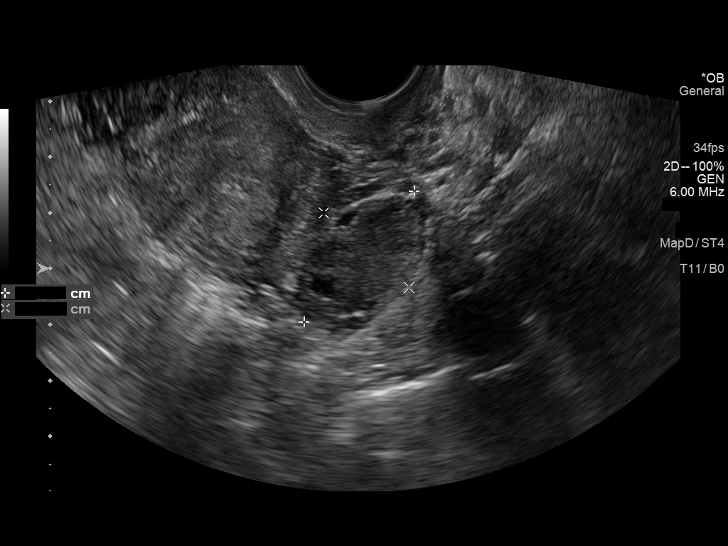
[im 31/49]
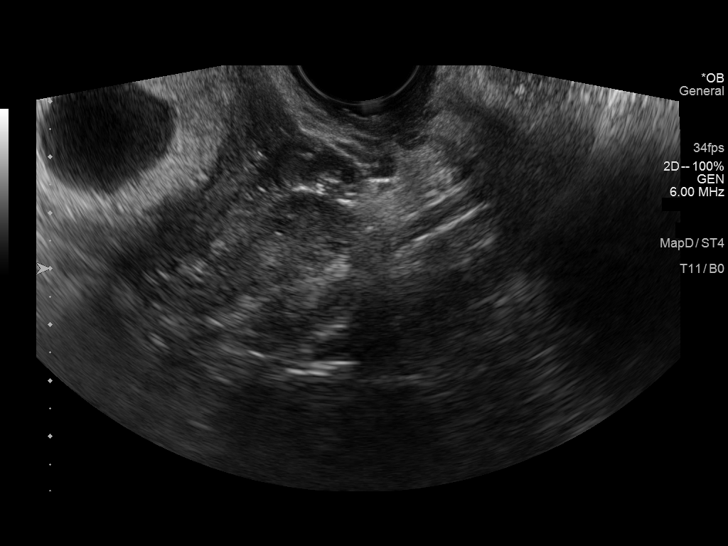
[im 34/49]
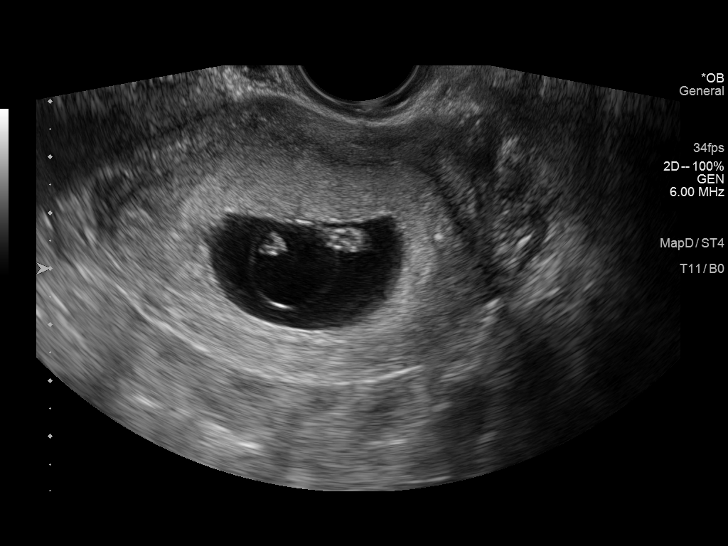
[im 38/49]
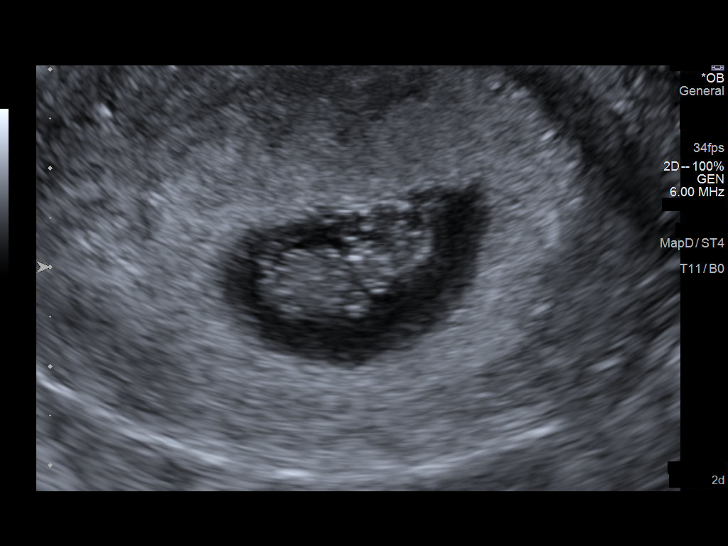
[im 41/49]
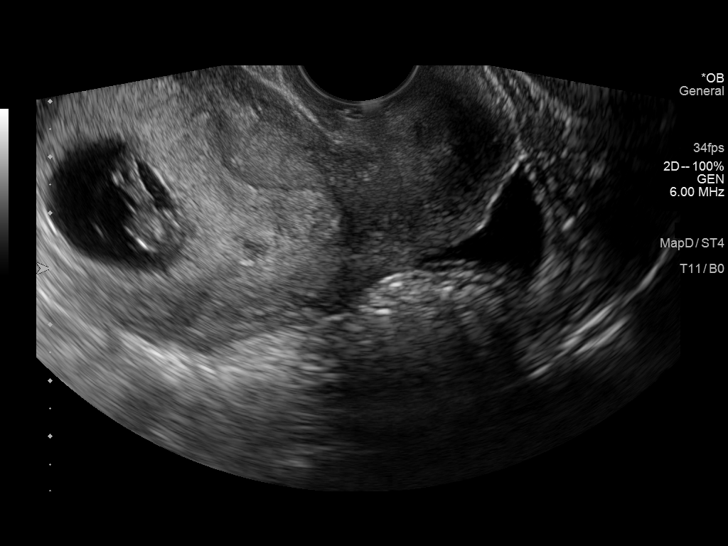
[im 45/49]
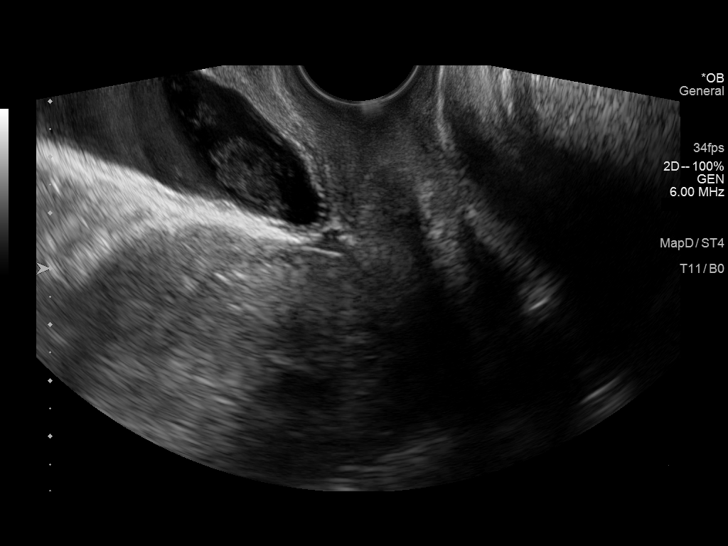
[im 49/49]
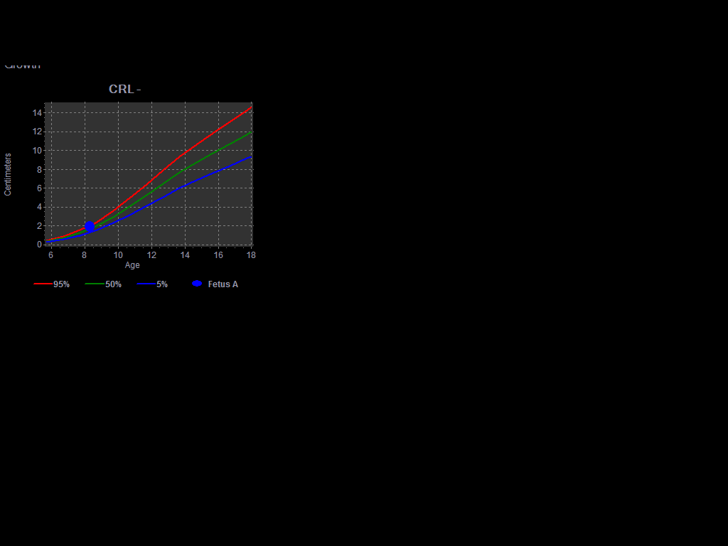

[14 of 28 positions shown; findings below may reference images not displayed]

FINDINGS: Intrauterine gestational sac: Single intrauterine gestational sac

Yolk sac:  Seen

Embryo:  Present

Cardiac Activity: Detected

Heart Rate: 152  bpm

CRL:  19  mm   8 w   3 d                  US EDC: 07/30/2016

Subchorionic hemorrhage:  None visualized.

Maternal uterus/adnexae: The maternal ovaries appear unremarkable.
The right ovary measures 3.5 x 2.6 x 2.5 cm and the left ovary
measures 3.1 x 2.0 x 2.6 cm. Trace free fluid noted within the
pelvis.

Low-level echogenic debris noted within the bladder.
IMPRESSION: Single live intrauterine pregnancy with an estimated gestational age
of 8 weeks, 2 days based on the first ultrasound.

Small subchorionic hemorrhage.

## 2016-09-14 ENCOUNTER — Emergency Department (HOSPITAL_COMMUNITY)
Admission: EM | Admit: 2016-09-14 | Discharge: 2016-09-14 | Disposition: A | Discharge status: Home / Self Care | Payer: Medicaid Other | Attending: Emergency Medicine | Admitting: Emergency Medicine

## 2016-09-14 ENCOUNTER — Encounter (HOSPITAL_COMMUNITY): Payer: Self-pay | Admitting: Emergency Medicine

## 2016-09-14 DIAGNOSIS — I10 Essential (primary) hypertension: Secondary | ICD-10-CM | POA: Insufficient documentation

## 2016-09-14 DIAGNOSIS — J029 Acute pharyngitis, unspecified: Secondary | ICD-10-CM | POA: Insufficient documentation

## 2016-09-14 LAB — RAPID STREP SCREEN (MED CTR MEBANE ONLY): Streptococcus, Group A Screen (Direct): NEGATIVE

## 2016-09-14 MED ORDER — IBUPROFEN 100 MG/5ML PO SUSP: 400.0000 mg | Freq: Four times a day (QID) | ORAL | 0 refills | Status: DC | PRN

## 2016-09-14 NOTE — ED Notes (Signed)
Declined W/C at D/C and was escorted to lobby by RN. 

## 2016-09-14 NOTE — Discharge Instructions (Signed)
Please read and follow all provided instructions.  Your diagnoses today include:  1. Viral pharyngitis    You appear to have an upper respiratory infection (URI). An upper respiratory tract infection, or cold, is a viral infection of the air passages leading to the lungs. It should improve gradually after 5-7 days. You may have a lingering cough that lasts for 2- 4 weeks after the infection.  Tests performed today include: Vital signs. See below for your results today.   Medications prescribed:   Take any prescribed medications only as directed. Treatment for your infection is aimed at treating the symptoms. There are no medications, such as antibiotics, that will cure your infection.   Home care instructions:  Follow any educational materials contained in this packet.   Your illness is contagious and can be spread to others, especially during the first 3 or 4 days. It cannot be cured by antibiotics or other medicines. Take basic precautions such as washing your hands often, covering your mouth when you cough or sneeze, and avoiding public places where you could spread your illness to others.   Please continue drinking plenty of fluids.  Use over-the-counter medicines as needed as directed on packaging for symptom relief.  You may also use ibuprofen or tylenol as directed on packaging for pain or fever.  Do not take multiple medicines containing Tylenol or acetaminophen to avoid taking too much of this medication.  Follow-up instructions: Please follow-up with your primary care provider in the next 3 days for further evaluation of your symptoms if you are not feeling better.   Return instructions:  Please return to the Emergency Department if you experience worsening symptoms.  RETURN IMMEDIATELY IF you develop shortness of breath, confusion or altered mental status, a new rash, become dizzy, faint, or poorly responsive, or are unable to be cared for at home. Please return if you have  persistent vomiting and cannot keep down fluids or develop a fever that is not controlled by tylenol or motrin.   Please return if you have any other emergent concerns.  Additional Information:  Your vital signs today were: BP 133/89 (BP Location: Left Arm)    Pulse 82    Temp 98.5 F (36.9 C) (Oral)    Resp 17    Ht 5\' 5"  (1.651 m)    LMP 10/25/2015    SpO2 100%  If your blood pressure (BP) was elevated above 135/85 this visit, please have this repeated by your doctor within one month. --------------

## 2016-09-14 NOTE — ED Triage Notes (Signed)
Pt. Stated, I have a white pocket in my throat that hurts.

## 2016-09-14 NOTE — ED Provider Notes (Signed)
[facility] DEPT Provider Note   CSN: [id number] Arrival date & time: 09/14/16  1425   By signing my name below, I, Macon Large, attest that this documentation has been prepared under the direction and in the presence of  Shary Decamp PA-C Electronically Signed: Macon Large, ED Scribe. 09/14/16. 3:34 PM. History   Chief Complaint Chief Complaint  Patient presents with  . Sore Throat    The history is provided by the patient. No language interpreter was used.   HPI Comments: Melanie Lopez is a 25 y.o. female who presents to the Emergency Department complaining of a constant, sore throat and ear pain onset last night. She states taking tylenol with relief. She rates her pain as 4/10 currently. She denies associated cough, fever, rhinorrhea, and trouble swallowing. No pain on ROM of neck. She states taking tylenol with relief. No other symptoms noted.    Past Medical History:  Diagnosis Date  . Anemia   . Hypertension     Patient Active Problem List   Diagnosis Date Noted  . Post-dates pregnancy 08/07/2016    Past Surgical History:  Procedure Laterality Date  . NO PAST SURGERIES      OB History    Gravida Para Term Preterm AB Living   1 1 1     1    SAB TAB Ectopic Multiple Live Births         0 1     Home Medications    Prior to Admission medications   Medication Sig Start Date End Date Taking? Authorizing Provider  ibuprofen (ADVIL,MOTRIN) 600 MG tablet Take 1 tablet (600 mg total) by mouth every 6 (six) hours as needed. 08/10/16   Deirdre Freida Busman, CNM  norethindrone (MICRONOR,CAMILA,ERRIN) 0.35 MG tablet Take 1 tablet (0.35 mg total) by mouth daily. 08/10/16   Deirdre Freida Busman, CNM  Prenatal Vit-Fe Fumarate-FA (MULTIVITAMIN-PRENATAL) 27-0.8 MG TABS tablet Take 1 tablet by mouth daily at 12 noon.     Historical Provider, MD   Family History Family History  Problem Relation Age of Onset  . Anemia Mother   . Hypertension Father   . Hypertension Sister   .  Anemia Sister   . Anemia Brother   . Anemia Maternal Aunt   . Anemia Maternal Grandmother   . Diabetes Paternal Grandmother   . Hypertension Paternal Grandmother    Social History Social History  Substance Use Topics  . Smoking status: Never Smoker  . Smokeless tobacco: Never Used  . Alcohol use No   Allergies   Flagyl [metronidazole]  Review of Systems Review of Systems  Constitutional: Negative for fever.  HENT: Positive for ear pain and sore throat. Negative for rhinorrhea and trouble swallowing.   Respiratory: Negative for cough.    Physical Exam Updated Vital Signs BP 133/89 (BP Location: Left Arm)   Pulse 82   Temp 98.5 F (36.9 C) (Oral)   Resp 17   Ht 5\' 5"  (1.651 m)   LMP 10/25/2015   SpO2 100%   Physical Exam  Constitutional: She is oriented to person, place, and time. Vital signs are normal. She appears well-developed and well-nourished.  Pt phonating well  HENT:  Head: Normocephalic and atraumatic.  Right Ear: Hearing normal.  Left Ear: Hearing normal.  Posterior oropharynx has minimal erythema. Exudate noted left tonsil. No trismus. No uvular deviation.  Eyes: Conjunctivae and EOM are normal. Pupils are equal, round, and reactive to light. Right eye exhibits no discharge. Left eye exhibits no discharge.  Neck: Normal range of motion.  FROM of neck without difficulty.   Cardiovascular: Normal rate and regular rhythm.   Pulmonary/Chest: Effort normal. No respiratory distress.  Neurological: She is alert and oriented to person, place, and time. Coordination normal.  Skin: Skin is warm and dry. No rash noted. She is not diaphoretic. No erythema.  Psychiatric: She has a normal mood and affect. Her speech is normal and behavior is normal. Thought content normal.  Nursing note and vitals reviewed.  ED Treatments / Results   DIAGNOSTIC STUDIES: Oxygen Saturation is 100% on RA, normal by my interpretation.    COORDINATION OF CARE: 3:32 PM Discussed  treatment plan with pt at bedside which includes strep test and pt agreed to plan.  Labs (all labs ordered are listed, but only abnormal results are displayed) Labs Reviewed  RAPID STREP SCREEN (NOT AT [facility])  CULTURE, GROUP A STREP [facility])   EKG  EKG Interpretation None      Radiology No results found.  Procedures Procedures (including critical care time)  Medications Ordered in ED Medications - No data to display   Initial Impression / Assessment and Plan / ED Course  I have reviewed the triage vital signs and the nursing notes.  Pertinent labs & imaging results that were available during my care of the patient were reviewed by me and considered in my medical decision making (see chart for details).  Clinical Course  I have reviewed the relevant previous healthcare records. I obtained HPI from historian. Patient discussed with supervising physician  ED Course:  Assessment: Pt with negative strep. Culture sent. Diagnosis of viral pharyngitis. No abx indicated at this time. Discussed that results of strep culture are pending and patient will be informed if positive result and abx will be called in at that time. Discharge with symptomatic tx. No evidence of dehydration. Pt is tolerating secretions. Presentation not concerning for peritonsillar abscess or spread of infection to deep spaces of the throat; patent airway. Specific return precautions discussed. Recommended PCP follow up. Pt appears safe for discharge.  Disposition/Plan:  DC Home Additional Verbal discharge instructions given and discussed with patient.  Pt Instructed to f/u with PCP in the next week for evaluation and treatment of symptoms. Return precautions given Pt acknowledges and agrees with plan  Supervising Physician Davonna Belling, MD  Final Clinical Impressions(s) / ED Diagnoses   Final diagnoses:  Viral pharyngitis    New Prescriptions New Prescriptions   No medications on file   I  personally performed the services described in this documentation, which was scribed in my presence. The recorded information has been reviewed and is accurate.     Shary Decamp, PA-C 09/14/16 1620    Davonna Belling, MD 09/15/16 [phone]

## 2016-09-16 LAB — CULTURE, GROUP A STREP (THRC)

## 2018-03-10 ENCOUNTER — Emergency Department (HOSPITAL_COMMUNITY)
Admission: EM | Admit: 2018-03-10 | Discharge: 2018-03-10 | Disposition: A | Discharge status: Home / Self Care | Payer: Medicaid Other | Attending: Emergency Medicine | Admitting: Emergency Medicine

## 2018-03-10 ENCOUNTER — Emergency Department (HOSPITAL_COMMUNITY): Payer: Medicaid Other

## 2018-03-10 ENCOUNTER — Encounter (HOSPITAL_COMMUNITY): Payer: Self-pay | Admitting: Emergency Medicine

## 2018-03-10 DIAGNOSIS — Y999 Unspecified external cause status: Secondary | ICD-10-CM | POA: Diagnosis not present

## 2018-03-10 DIAGNOSIS — I10 Essential (primary) hypertension: Secondary | ICD-10-CM | POA: Insufficient documentation

## 2018-03-10 DIAGNOSIS — S99912A Unspecified injury of left ankle, initial encounter: Secondary | ICD-10-CM | POA: Diagnosis present

## 2018-03-10 DIAGNOSIS — X501XXA Overexertion from prolonged static or awkward postures, initial encounter: Secondary | ICD-10-CM | POA: Diagnosis not present

## 2018-03-10 DIAGNOSIS — Y929 Unspecified place or not applicable: Secondary | ICD-10-CM | POA: Diagnosis not present

## 2018-03-10 DIAGNOSIS — S93402A Sprain of unspecified ligament of left ankle, initial encounter: Secondary | ICD-10-CM | POA: Insufficient documentation

## 2018-03-10 DIAGNOSIS — Y9389 Activity, other specified: Secondary | ICD-10-CM | POA: Insufficient documentation

## 2018-03-10 DIAGNOSIS — Z79899 Other long term (current) drug therapy: Secondary | ICD-10-CM | POA: Insufficient documentation

## 2018-03-10 MED ORDER — NAPROXEN 250 MG PO TABS
500.0000 mg | ORAL_TABLET | Freq: Once | ORAL | Status: AC
Start: 2018-03-10 — End: 2018-03-10
  Administered 2018-03-10: 500 mg via ORAL
  Filled 2018-03-10: qty 2

## 2018-03-10 NOTE — ED Provider Notes (Signed)
[facility] EMERGENCY DEPARTMENT Provider Note   CSN: [id number] Arrival date & time: 03/10/18  1047     History   Chief Complaint Chief Complaint  Patient presents with  . Ankle Pain    HPI Melanie Lopez is a 27 y.o. female.  Melanie Lopez is a 27 y.o. Female with a history of hypertension and anemia, presents to the emergency department for evaluation of pain and swelling over the lateral aspect of the ankle.  Patient reports she was trying to catch her 38-year-old son and keep him from falling yesterday and rolled her left ankle in the process.  Since then she has had pain and swelling over the lateral aspect of the ankle, patient reports pain is a constant aching and throbbing pain.  She denies overlying redness or warmth.  Pain is made worse with range of motion or weightbearing.  She has not tried any medications at home prior to arrival.  Patient denies any numbness or tingling.  No pain in the forefoot or knee.     Past Medical History:  Diagnosis Date  . Anemia   . Hypertension     Patient Active Problem List   Diagnosis Date Noted  . Post-dates pregnancy 08/07/2016    Past Surgical History:  Procedure Laterality Date  . NO PAST SURGERIES       OB History    Gravida  1   Para  1   Term  1   Preterm      AB      Living  1     SAB      TAB      Ectopic      Multiple  0   Live Births  1            Home Medications    Prior to Admission medications   Medication Sig Start Date End Date Taking? Authorizing Provider  ibuprofen (ADVIL,MOTRIN) 100 MG/5ML suspension Take 20 mLs (400 mg total) by mouth every 6 (six) hours as needed. 09/14/16   Shary Decamp, PA-C  norethindrone (MICRONOR,CAMILA,ERRIN) 0.35 MG tablet Take 1 tablet (0.35 mg total) by mouth daily. 08/10/16   Poe, Deirdre C, CNM  Prenatal Vit-Fe Fumarate-FA (MULTIVITAMIN-PRENATAL) 27-0.8 MG TABS tablet Take 1 tablet by mouth daily at 12 noon.     [provider]    Family History Family History  Problem Relation Age of Onset  . Anemia Mother   . Hypertension Father   . Hypertension Sister   . Anemia Sister   . Anemia Brother   . Anemia Maternal Aunt   . Anemia Maternal Grandmother   . Diabetes Paternal Grandmother   . Hypertension Paternal Grandmother     Social History Social History   Tobacco Use  . Smoking status: Never Smoker  . Smokeless tobacco: Never Used  Substance Use Topics  . Alcohol use: No  . Drug use: No     Allergies   Flagyl [metronidazole]   Review of Systems Review of Systems  Constitutional: Negative for chills and fever.  Musculoskeletal: Positive for arthralgias and joint swelling.  Skin: Negative for color change, rash and wound.  Neurological: Negative for weakness and numbness.     Physical Exam Updated Vital Signs BP 135/88 (BP Location: Right Arm)   Pulse 94   Temp 98 F (36.7 C) (Oral)   Resp 18   LMP 02/23/2018 (Exact Date)   SpO2 100%   Physical Exam  Constitutional: She  appears well-developed and well-nourished. No distress.  HENT:  Head: Normocephalic and atraumatic.  Eyes: Right eye exhibits no discharge. Left eye exhibits no discharge.  Pulmonary/Chest: Effort normal. No respiratory distress.  Musculoskeletal:  Tenderness and swelling over the lateral aspect of the left ankle, no overlying erythema or warmth, range of motion limited by pain, patient able to wiggle all toes, no tenderness to palpation over the medial aspect of the ankle or the forefoot, no tenderness over the calcaneus, normal knee exam.  2+ DP and TP pulses, sensation intact.  Neurological: She is alert. Coordination normal.  Skin: Skin is warm and dry. Capillary refill takes less than 2 seconds. She is not diaphoretic.  Psychiatric: She has a normal mood and affect. Her behavior is normal.  Nursing note and vitals reviewed.    ED Treatments / Results  Labs (all labs ordered are listed, but  only abnormal results are displayed) Labs Reviewed - No data to display  EKG None  Radiology Dg Ankle Complete Left  Result Date: 03/10/2018 CLINICAL DATA:  27 year old female status post fall yesterday with ankle pain and decreased range of motion. EXAM: LEFT ANKLE COMPLETE - 3+ VIEW COMPARISON:  None. FINDINGS: Anterior and lateral soft tissue swelling and stranding. No ankle joint effusion is evident. Bone mineralization is within normal limits. Normal mortise joint alignment. Talar dome intact. The distal tibia and fibula appear intact. No fracture identified, including in the visible bones of the left foot. IMPRESSION: Soft tissue swelling with no fracture or dislocation identified about the left ankle. Electronically Signed   By: Genevie Ann M.D.   On: 03/10/2018 11:27    Procedures Procedures (including critical care time)  Medications Ordered in ED Medications  naproxen (NAPROSYN) tablet 500 mg (500 mg Oral Given 03/10/18 1215)     Initial Impression / Assessment and Plan / ED Course  I have reviewed the triage vital signs and the nursing notes.  Pertinent labs & imaging results that were available during my care of the patient were reviewed by me and considered in my medical decision making (see chart for details).  Presentation consistent with ankle sprain. Tenderness and swelling over lateral malleolus, pt is neurovascularly intact, and x-ray negative for fracture, and shows ankle mortise is intact. Pain treated in the ED. Pt placed in ASO brace and provided crutches, ambulated without difficulty. Pt stable for discharge home with ibuprofen for pain. Pt to follow-up with ortho in one week if symptoms not improving. Return precautions discussed, Pt expresses understanding and agrees with plan.   Final Clinical Impressions(s) / ED Diagnoses   Final diagnoses:  Sprain of left ankle, unspecified ligament, initial encounter    ED Discharge Orders    None       Jacqlyn Larsen,  [place] 03/10/18 1219    Malvin Johns, MD 03/10/18 1415

## 2018-03-10 NOTE — ED Triage Notes (Signed)
Patient states that she fell while hold her son yesterday. She took some Ibuprofen and applied ice-she reports still being in pain

## 2018-03-10 NOTE — Discharge Instructions (Signed)
Use ASO brace and crutches, elevate ankle and use ice as much as possible, Aleve and Tylenol to help with pain.  If symptoms are not improving after 1 week of this treatment you can follow-up with sports medicine.  Return to the emergency department for severely worsened pain, redness, swelling, warmth, fevers or chills.

## 2018-07-16 ENCOUNTER — Emergency Department (HOSPITAL_COMMUNITY): Payer: Medicaid Other

## 2018-07-16 ENCOUNTER — Other Ambulatory Visit: Payer: Self-pay

## 2018-07-16 ENCOUNTER — Emergency Department (HOSPITAL_COMMUNITY)
Admission: EM | Admit: 2018-07-16 | Discharge: 2018-07-16 | Disposition: A | Discharge status: Home / Self Care | Payer: Medicaid Other | Attending: Emergency Medicine | Admitting: Emergency Medicine

## 2018-07-16 ENCOUNTER — Encounter (HOSPITAL_COMMUNITY): Payer: Self-pay

## 2018-07-16 DIAGNOSIS — Z79899 Other long term (current) drug therapy: Secondary | ICD-10-CM | POA: Insufficient documentation

## 2018-07-16 DIAGNOSIS — R42 Dizziness and giddiness: Secondary | ICD-10-CM | POA: Diagnosis not present

## 2018-07-16 DIAGNOSIS — R110 Nausea: Secondary | ICD-10-CM | POA: Insufficient documentation

## 2018-07-16 DIAGNOSIS — I10 Essential (primary) hypertension: Secondary | ICD-10-CM | POA: Insufficient documentation

## 2018-07-16 DIAGNOSIS — Z77098 Contact with and (suspected) exposure to other hazardous, chiefly nonmedicinal, chemicals: Secondary | ICD-10-CM | POA: Diagnosis not present

## 2018-07-16 DIAGNOSIS — R0602 Shortness of breath: Secondary | ICD-10-CM | POA: Diagnosis not present

## 2018-07-16 LAB — COMPREHENSIVE METABOLIC PANEL
ALK PHOS: 78 U/L (ref 38–126)
ALT: 14 U/L (ref 0–44)
ANION GAP: 7 (ref 5–15)
AST: 19 U/L (ref 15–41)
Albumin: 3.7 g/dL (ref 3.5–5.0)
BUN: 8 mg/dL (ref 6–20)
CALCIUM: 8.9 mg/dL (ref 8.9–10.3)
CHLORIDE: 106 mmol/L (ref 98–111)
CO2: 26 mmol/L (ref 22–32)
Creatinine, Ser: 0.78 mg/dL (ref 0.44–1.00)
GFR calc non Af Amer: >60 mL/min (ref >60)
GLUCOSE: 96 mg/dL (ref 70–99)
POTASSIUM: 4.4 mmol/L (ref 3.5–5.1)
SODIUM: 139 mmol/L (ref 135–145)
Total Bilirubin: 0.6 mg/dL (ref 0.3–1.2)
Total Protein: 7.9 g/dL (ref 6.5–8.1)

## 2018-07-16 LAB — CBC WITH DIFFERENTIAL/PLATELET
ABS IMMATURE GRANULOCYTES: 0 10*3/uL (ref 0.0–0.1)
BASOS PCT: 0 %
Basophils Absolute: 0 10*3/uL (ref 0.0–0.1)
Eosinophils Absolute: 0.1 10*3/uL (ref 0.0–0.7)
Eosinophils Relative: 1 %
HEMATOCRIT: 37.8 % (ref 36.0–46.0)
HEMOGLOBIN: 11.6 g/dL — AB (ref 12.0–15.0)
IMMATURE GRANULOCYTES: 0 %
LYMPHS ABS: 1.8 10*3/uL (ref 0.7–4.0)
LYMPHS PCT: 37 %
MCH: 26.5 pg (ref 26.0–34.0)
MCHC: 30.7 g/dL (ref 30.0–36.0)
MCV: 86.5 fL (ref 78.0–100.0)
MONO ABS: 0.6 10*3/uL (ref 0.1–1.0)
MONOS PCT: 13 %
NEUTROS ABS: 2.3 10*3/uL (ref 1.7–7.7)
NEUTROS PCT: 49 %
PLATELETS: 348 10*3/uL (ref 150–400)
RBC: 4.37 MIL/uL (ref 3.87–5.11)
RDW: 13.1 % (ref 11.5–15.5)
WBC: 4.8 10*3/uL (ref 4.0–10.5)

## 2018-07-16 LAB — I-STAT BETA HCG BLOOD, ED (MC, WL, AP ONLY): I-stat hCG, quantitative: <5 m[IU]/mL (ref <5)

## 2018-07-16 LAB — I-STAT TROPONIN, ED: Troponin i, poc: 0 ng/mL (ref 0.00–0.08)

## 2018-07-16 NOTE — ED Notes (Signed)
Patient's friend updated on wait times and delays.  Apologized for waits.

## 2018-07-16 NOTE — ED Notes (Signed)
Results reviewed.  No changes in acuity at this time 

## 2018-07-16 NOTE — ED Provider Notes (Signed)
Patient placed in Quick Look pathway, seen and evaluated   Chief Complaint: Toxic inhalation  HPI:   Patient works for [employer].  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  She eventually noted that a fluid was leaking from one of the packages.  It was noted that this package contained embalming fluid.  She continues to have headache as well as some lightheadedness, chest pain, and shortness of breath.  ROS: Chest pain (one)  Physical Exam:   Gen: No distress  Neuro: Awake and Alert  Skin: Warm    Focused Exam:   No diaphoresis.  No pallor.  Pulmonary: No increased work of breathing.  Speaks in full sentences without difficulty.  Lung sounds clear.  No tachypnea.  Cardiac: Normal rate and regular. Peripheral pulses intact.  Abdominal: No abdominal tenderness.  No peritoneal signs.  No rebound tenderness.  No guarding.   Neurologic: Moves all 4 extremities. No facial droop.   MSK: No peripheral edema.   Initiation of care has begun. The patient has been counseled on the process, plan, and necessity for staying for the completion/evaluation, and the remainder of the medical screening examination   Melanie Lopez 07/16/18 1817    Drenda Freeze, MD 07/17/18 [phone]

## 2018-07-16 NOTE — Discharge Instructions (Addendum)
Symptoms should improve over time. As discussed, all labs and x-ray are negative. You can be discharged home and should follow up with your doctor if symptoms persists. Return to the emergency department with any new concerns.

## 2018-07-16 NOTE — ED Provider Notes (Signed)
[facility] EMERGENCY DEPARTMENT Provider Note   CSN: [id number] Arrival date & time: 07/16/18  1650     History   Chief Complaint Chief Complaint  Patient presents with  . Toxic Inhalation    HPI Melanie Lopez is a 27 y.o. female.  Patient here for evaluation of nausea, lightheadedness, SOB that started yesterday about one hour after getting to work at [employer]. She was working in a loaded truck and noticed an odor on arrival and later developed symptoms. She reports she was told there was a leak in one of the boxes that contained embalming fluid. No direct contact with the fluid. She feels her exposure was breathing in the fumes only. No syncope, fever, rash, visual changes.   The history is provided by the patient. No language interpreter was used.    Past Medical History:  Diagnosis Date  . Anemia   . Hypertension     Patient Active Problem List   Diagnosis Date Noted  . Post-dates pregnancy 08/07/2016    Past Surgical History:  Procedure Laterality Date  . NO PAST SURGERIES       OB History    Gravida  1   Para  1   Term  1   Preterm      AB      Living  1     SAB      TAB      Ectopic      Multiple  0   Live Births  1            Home Medications    Prior to Admission medications   Medication Sig Start Date End Date Taking? Authorizing Provider  ibuprofen (ADVIL,MOTRIN) 100 MG/5ML suspension Take 20 mLs (400 mg total) by mouth every 6 (six) hours as needed. 09/14/16   Shary Decamp, PA-C  norethindrone (MICRONOR,CAMILA,ERRIN) 0.35 MG tablet Take 1 tablet (0.35 mg total) by mouth daily. 08/10/16   Poe, Deirdre C, CNM  Prenatal Vit-Fe Fumarate-FA (MULTIVITAMIN-PRENATAL) 27-0.8 MG TABS tablet Take 1 tablet by mouth daily at 12 noon.     [provider]    Family History Family History  Problem Relation Age of Onset  . Anemia Mother   . Hypertension Father   . Hypertension Sister   . Anemia Sister   . Anemia  Brother   . Anemia Maternal Aunt   . Anemia Maternal Grandmother   . Diabetes Paternal Grandmother   . Hypertension Paternal Grandmother     Social History Social History   Tobacco Use  . Smoking status: Never Smoker  . Smokeless tobacco: Never Used  Substance Use Topics  . Alcohol use: Yes    Comment: occ  . Drug use: No     Allergies   Flagyl [metronidazole]   Review of Systems Review of Systems  Constitutional: Positive for fever.  HENT: Negative for congestion, facial swelling and trouble swallowing.   Eyes: Negative for visual disturbance.  Respiratory: Positive for chest tightness and shortness of breath. Negative for wheezing and stridor.   Gastrointestinal: Positive for nausea. Negative for abdominal pain and vomiting.  Skin: Negative for rash.  Neurological: Positive for light-headedness. Negative for syncope.     Physical Exam Updated Vital Signs BP 120/77 (BP Location: Left Arm)   Pulse 65   Temp 99.2 F (37.3 C) (Oral)   Resp 16   Ht 5\' 6"  (1.676 m)   Wt 88.5 kg   LMP 06/25/2018 (Exact Date)  SpO2 97%   Breastfeeding? No   BMI 31.47 kg/m   Physical Exam  Constitutional: She is oriented to person, place, and time. She appears well-developed and well-nourished.  HENT:  Head: Normocephalic.  Neck: Normal range of motion. Neck supple.  Cardiovascular: Normal rate and regular rhythm.  No murmur heard. Pulmonary/Chest: Effort normal and breath sounds normal. No stridor. She has no wheezes. She has no rales. She exhibits tenderness (Mild bilateral anterior chest wall tenderness. ).  Abdominal: Soft. Bowel sounds are normal. There is no tenderness. There is no rebound and no guarding.  Musculoskeletal: Normal range of motion.  Neurological: She is alert and oriented to person, place, and time.  Skin: Skin is warm and dry. No rash noted.  Psychiatric: She has a normal mood and affect.     ED Treatments / Results  Labs (all labs ordered are  listed, but only abnormal results are displayed) Labs Reviewed  CBC WITH DIFFERENTIAL/PLATELET - Abnormal; Notable for the following components:      Result Value   Hemoglobin 11.6 (*)    All other components within normal limits  COMPREHENSIVE METABOLIC PANEL  I-STAT TROPONIN, ED  I-STAT BETA HCG BLOOD, ED ([facility], [facility], [facility] ONLY)    EKG EKG Interpretation  Date/Time:  Thursday July 16 2018 17:30:39 EDT Ventricular Rate:  79 PR Interval:  130 QRS Duration: 76 QT Interval:  372 QTC Calculation: 426 R Axis:   51 Text Interpretation:  Poor data quality, interpretation may be adversely affected Normal sinus rhythm Normal ECG When compared with ECG of 06/25/2016, QT has shortened Nonspecific ST and T wave abnormality is no longer present Confirmed by Delora Fuel ([id number]) on 07/16/2018 11:17:05 PM   Radiology Dg Chest 2 View  Result Date: 07/16/2018 CLINICAL DATA:  27 year old female who inhaled involving fluid yesterday during accidental spill at work. Dyspnea with burning sensation of the nasal cavity, throat and lungs. Nonsmoker. EXAM: CHEST - 2 VIEW COMPARISON:  None. FINDINGS: The heart size and mediastinal contours are within normal limits. Both lungs are clear. No pulmonary edema, effusion or pneumothorax. The visualized skeletal structures are unremarkable. IMPRESSION: No active cardiopulmonary disease. Electronically Signed   By: Ashley Royalty M.D.   On: 07/16/2018 19:09    Procedures Procedures (including critical care time)  Medications Ordered in ED Medications - No data to display   Initial Impression / Assessment and Plan / ED Course  I have reviewed the triage vital signs and the nursing notes.  Pertinent labs & imaging results that were available during my care of the patient were reviewed by me and considered in my medical decision making (see chart for details).  Clinical Course as of Jul 16 2317  Thu Jul 16, 2018  1814 Spoke with poison control. Typically would not have  any continued effects from inhalation that occurred yesterday.    [SJ]    Clinical Course User Index [SJ] Joy, Shawn C, PA-C    Patient presents after exposure to fumes from embalming fluids and symptoms of nausea, lightheadedness, SOB. Persistent since exposure yesterday.  VSS, labs normal. CXR clear. Patient reassured. She is felt appropriate for discharge home.  Final Clinical Impressions(s) / ED Diagnoses   Final diagnoses:  None   1. Chemical exposure  ED Discharge Orders    None       Charlann Lange, Hershal Coria 07/17/18 0111    Jola Schmidt, MD 07/17/18 [phone]

## 2018-07-16 NOTE — ED Triage Notes (Addendum)
Pt endorses inhaling "embalming fluid" yesterday at work by accident. 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 After pt began having headache, lightheaded, sore throat, cough and abd pain. VSS.

## 2019-05-31 ENCOUNTER — Other Ambulatory Visit: Payer: Self-pay

## 2019-05-31 ENCOUNTER — Ambulatory Visit
Admission: EM | Admit: 2019-05-31 | Discharge: 2019-05-31 | Disposition: A | Discharge status: Home / Self Care | Payer: Medicaid Other | Attending: Physician Assistant | Admitting: Physician Assistant

## 2019-05-31 DIAGNOSIS — I10 Essential (primary) hypertension: Secondary | ICD-10-CM

## 2019-05-31 DIAGNOSIS — T148XXA Other injury of unspecified body region, initial encounter: Secondary | ICD-10-CM

## 2019-05-31 MED ORDER — MUPIROCIN 2 % EX OINT: 1.0000 "application " | TOPICAL_OINTMENT | Freq: Two times a day (BID) | CUTANEOUS | 0 refills | Status: DC

## 2019-05-31 NOTE — Discharge Instructions (Signed)
No signs of infection at this time. Start bactroban for the first 2-3 days. Keep wound clean and dry. Monitor for spreading redness, warmth, fever, follow up for reevaluation needed.

## 2019-05-31 NOTE — ED Triage Notes (Signed)
Pt c/o an open wound to upper buttocks x2 days with pain and bleeding.

## 2019-05-31 NOTE — ED Provider Notes (Signed)
[facility]    CSN: [id number] Arrival date & time: 05/31/19  1518      History   Chief Complaint Chief Complaint  Patient presents with  . Wound Check    HPI Melanie Lopez is a 28 y.o. female.   28 year old female comes in for wound to the mid buttock. She noticed it 2 days ago when it started bleeding. Denies any pain, itching. Given location, unsure of erythema, warmth. Denies fever, chills, night sweats. Unsure how she got the wound. Applied Vaseline.          Past Medical History:  Diagnosis Date  . Anemia   . Hypertension     Patient Active Problem List   Diagnosis Date Noted  . Post-dates pregnancy 08/07/2016    Past Surgical History:  Procedure Laterality Date  . NO PAST SURGERIES      OB History    Gravida  1   Para  1   Term  1   Preterm      AB      Living  1     SAB      TAB      Ectopic      Multiple  0   Live Births  1            Home Medications    Prior to Admission medications   Medication Sig Start Date End Date Taking? Authorizing Provider  ibuprofen (ADVIL,MOTRIN) 100 MG/5ML suspension Take 20 mLs (400 mg total) by mouth every 6 (six) hours as needed. 09/14/16   Shary Decamp, PA-C  mupirocin ointment (BACTROBAN) 2 % Apply 1 application topically 2 (two) times daily. 05/31/19   Ok Edwards, PA-C    Family History Family History  Problem Relation Age of Onset  . Anemia Mother   . Hypertension Father   . Hypertension Sister   . Anemia Sister   . Anemia Brother   . Anemia Maternal Aunt   . Anemia Maternal Grandmother   . Diabetes Paternal Grandmother   . Hypertension Paternal Grandmother     Social History Social History   Tobacco Use  . Smoking status: Never Smoker  . Smokeless tobacco: Never Used  Substance Use Topics  . Alcohol use: Yes    Comment: occ  . Drug use: No     Allergies   Flagyl [metronidazole]   Review of Systems Review of Systems  Reason unable to perform ROS:  See HPI as above.     Physical Exam Triage Vital Signs ED Triage Vitals [05/31/19 1527]  Enc Vitals Group     BP (!) 144/89     Pulse Rate 85     Resp 18     Temp 98.5 F (36.9 C)     Temp Source Oral     SpO2 98 %     Weight      Height      Head Circumference      Peak Flow      Pain Score 4     Pain Loc      Pain Edu?      Excl. in [place]?    No data found.  Updated Vital Signs BP (!) 144/89 (BP Location: Left Arm)   Pulse 85   Temp 98.5 F (36.9 C) (Oral)   Resp 18   LMP 04/11/2019   SpO2 98%   Visual Acuity Right Eye Distance:   Left Eye Distance:   Bilateral  Distance:    Right Eye Near:   Left Eye Near:    Bilateral Near:     Physical Exam Constitutional:      General: She is not in acute distress.    Appearance: She is well-developed. She is not ill-appearing, toxic-appearing or diaphoretic.  HENT:     Head: Normocephalic and atraumatic.  Eyes:     Conjunctiva/sclera: Conjunctivae normal.     Pupils: Pupils are equal, round, and reactive to light.  Pulmonary:     Effort: Pulmonary effort is normal. No respiratory distress.  Skin:    General: Skin is dry.     Comments: 0.5cm round wound to the intergluteal cleft. No bleeding. No erythema, warmth, swelling. No tenderness to palpation.   Neurological:     Mental Status: She is alert and oriented to person, place, and time.      UC Treatments / Results  Labs (all labs ordered are listed, but only abnormal results are displayed) Labs Reviewed - No data to display  EKG None  Radiology No results found.  Procedures Procedures (including critical care time)  Medications Ordered in UC Medications - No data to display  Initial Impression / Assessment and Plan / UC Course  I have reviewed the triage vital signs and the nursing notes.  Pertinent labs & imaging results that were available during my care of the patient were reviewed by me and considered in my medical decision making (see chart  for details).    No signs of abscess, cellulitis. Wound is clean and dry. Wound care instructions given. Return precautions given. Patient expresses understanding and agrees to plan.  Final Clinical Impressions(s) / UC Diagnoses   Final diagnoses:  Open wound    ED Prescriptions    Medication Sig Dispense Auth. Provider   mupirocin ointment (BACTROBAN) 2 % Apply 1 application topically 2 (two) times daily. 22 g Tobin Chad, [place] 05/31/19 [phone]

## 2019-08-13 ENCOUNTER — Other Ambulatory Visit: Payer: Self-pay

## 2019-08-13 ENCOUNTER — Ambulatory Visit
Admission: EM | Admit: 2019-08-13 | Discharge: 2019-08-13 | Disposition: A | Discharge status: Home / Self Care | Payer: Medicaid Other | Attending: Physician Assistant | Admitting: Physician Assistant

## 2019-08-13 DIAGNOSIS — T148XXA Other injury of unspecified body region, initial encounter: Secondary | ICD-10-CM

## 2019-08-13 MED ORDER — NYSTATIN 100000 UNIT/GM EX POWD: Freq: Three times a day (TID) | CUTANEOUS | 0 refills | Status: DC

## 2019-08-13 MED ORDER — MUPIROCIN 2 % EX OINT: 1.0000 "application " | TOPICAL_OINTMENT | Freq: Two times a day (BID) | CUTANEOUS | 0 refills | Status: DC

## 2019-08-13 NOTE — ED Provider Notes (Signed)
[facility]    CSN: [id number] Arrival date & time: 08/13/19  1837      History   Chief Complaint Chief Complaint  Patient presents with  . Abscess    HPI Melanie Lopez is a 28 y.o. female.   28 year old female comes in with open wound to the intergluteal cleft.  She was seen 05/31/2019 for similar.  At that time, she had 1 open wound to the pilonidal area, she was given Bactroban ointment.  States pain had resolved after starting ointment, but wound is still present.  A few days ago, started noticing new wounds along the intergluteal cleft and came in for evaluation.  She denies spreading erythema, warmth.  Denies fever, chills.  Denies history of HSV to the area.  Has not tried anything else for the symptoms.     Past Medical History:  Diagnosis Date  . Anemia   . Hypertension     Patient Active Problem List   Diagnosis Date Noted  . Post-dates pregnancy 08/07/2016    Past Surgical History:  Procedure Laterality Date  . NO PAST SURGERIES      OB History    Gravida  1   Para  1   Term  1   Preterm      AB      Living  1     SAB      TAB      Ectopic      Multiple  0   Live Births  1            Home Medications    Prior to Admission medications   Medication Sig Start Date End Date Taking? Authorizing Provider  ibuprofen (ADVIL,MOTRIN) 100 MG/5ML suspension Take 20 mLs (400 mg total) by mouth every 6 (six) hours as needed. 09/14/16   Shary Decamp, PA-C  mupirocin ointment (BACTROBAN) 2 % Apply 1 application topically 2 (two) times daily. 08/13/19   Tasia Catchings, Amy V, PA-C  nystatin (MYCOSTATIN/NYSTOP) powder Apply topically 3 (three) times daily. 08/13/19   Ok Edwards, PA-C    Family History Family History  Problem Relation Age of Onset  . Anemia Mother   . Hypertension Father   . Hypertension Sister   . Anemia Sister   . Anemia Brother   . Anemia Maternal Aunt   . Anemia Maternal Grandmother   . Diabetes Paternal Grandmother   .  Hypertension Paternal Grandmother     Social History Social History   Tobacco Use  . Smoking status: Never Smoker  . Smokeless tobacco: Never Used  Substance Use Topics  . Alcohol use: Yes    Comment: occ  . Drug use: No     Allergies   Flagyl [metronidazole]   Review of Systems Review of Systems  Reason unable to perform ROS: See HPI as above.     Physical Exam Triage Vital Signs ED Triage Vitals  Enc Vitals Group     BP 08/13/19 1847 (!) 138/93     Pulse Rate 08/13/19 1847 82     Resp 08/13/19 1847 16     Temp 08/13/19 1847 98.8 F (37.1 C)     Temp Source 08/13/19 1847 Oral     SpO2 08/13/19 1847 98 %     Weight --      Height --      Head Circumference --      Peak Flow --      Pain Score 08/13/19 1852 7  Pain Loc --      Pain Edu? --      Excl. in [place]? --    No data found.  Updated Vital Signs BP (!) 138/93 (BP Location: Left Arm)   Pulse 82   Temp 98.8 F (37.1 C) (Oral)   Resp 16   LMP 08/09/2019   SpO2 98%   Physical Exam Exam conducted with a chaperone present.  Constitutional:      General: She is not in acute distress.    Appearance: She is well-developed. She is not diaphoretic.  HENT:     Head: Normocephalic and atraumatic.  Eyes:     Conjunctiva/sclera: Conjunctivae normal.     Pupils: Pupils are equal, round, and reactive to light.  Pulmonary:     Effort: Pulmonary effort is normal. No respiratory distress.  Genitourinary:    Comments: 3 open sores along the intergluteal cleft.  No surrounding erythema, warmth, induration.  No drainage.  Area is moist.  Mild tenderness to palpation. Skin:    General: Skin is warm and dry.  Neurological:     Mental Status: She is alert and oriented to person, place, and time.      UC Treatments / Results  Labs (all labs ordered are listed, but only abnormal results are displayed) Labs Reviewed - No data to display  EKG   Radiology No results found.  Procedures Procedures  (including critical care time)  Medications Ordered in UC Medications - No data to display  Initial Impression / Assessment and Plan / UC Course  I have reviewed the triage vital signs and the nursing notes.  Pertinent labs & imaging results that were available during my care of the patient were reviewed by me and considered in my medical decision making (see chart for details).    ? Skin breakdown due to moisture.  Patient to start Bactroban and nystatin to cover for infection.  Discussed to keep area clean and dry.  Given open sore seen during visit on 05/31/2019 still present, will have patient follow-up with PCP for further evaluation and management of wounds continue to stay open.  Return precautions given.  Final Clinical Impressions(s) / UC Diagnoses   Final diagnoses:  Open wound    ED Prescriptions    Medication Sig Dispense Auth. Provider   mupirocin ointment (BACTROBAN) 2 % Apply 1 application topically 2 (two) times daily. 30 g Yu, Amy V, PA-C   nystatin (MYCOSTATIN/NYSTOP) powder Apply topically 3 (three) times daily. 45 g Tobin Chad, [place] 08/13/19 1923

## 2019-08-13 NOTE — ED Triage Notes (Signed)
Pt presents to UC w/ c/o abscesses in rectal area since 2 months. Pt states she was seen about 2 months ago when she only had one and the other 2 have come up since then. Pt states the abscess she was seen for 2 months ago is not as painful as the other two. Pt reports pus coming out of abscess sites.

## 2019-08-13 NOTE — Discharge Instructions (Addendum)
Start bactroban as directed. Use nystatin as directed to cover for fungal infection, as well as to prevent increase in moisture. Keep area clean and dry. Follow up with PCP for further evaluation if wounds do not heal.

## 2019-08-13 NOTE — ED Notes (Signed)
Patient able to ambulate independently  

## 2020-08-23 ENCOUNTER — Ambulatory Visit
Admission: EM | Admit: 2020-08-23 | Discharge: 2020-08-23 | Disposition: A | Discharge status: Home / Self Care | Payer: Medicaid Other | Attending: Emergency Medicine | Admitting: Emergency Medicine

## 2020-08-23 ENCOUNTER — Other Ambulatory Visit: Payer: Self-pay

## 2020-08-23 DIAGNOSIS — R059 Cough, unspecified: Secondary | ICD-10-CM

## 2020-08-23 DIAGNOSIS — R05 Cough: Secondary | ICD-10-CM | POA: Diagnosis not present

## 2020-08-23 MED ORDER — BENZONATATE 100 MG PO CAPS: 100.0000 mg | ORAL_CAPSULE | Freq: Three times a day (TID) | ORAL | 0 refills | Status: DC

## 2020-08-23 NOTE — ED Triage Notes (Signed)
Pt is here with a cough for 4 days now, pt has taken Mucinex to relieve discomfort. Pt wants COVID testing.

## 2020-08-23 NOTE — ED Provider Notes (Signed)
[facility]    CSN: [id number] Arrival date & time: 08/23/20  1120      History   Chief Complaint Chief Complaint  Patient presents with  . Cough    HPI Melanie Lopez is a 29 y.o. female  Presenting for Covid testing.  Endorsing cough for last 4 days.  Mucinex is helping with symptoms.  No chest pain, shortness of breath or palpitations, fever.  No known sick contacts.  Past Medical History:  Diagnosis Date  . Anemia   . Hypertension     Patient Active Problem List   Diagnosis Date Noted  . Post-dates pregnancy 08/07/2016    Past Surgical History:  Procedure Laterality Date  . NO PAST SURGERIES      OB History    Gravida  1   Para  1   Term  1   Preterm      AB      Living  1     SAB      TAB      Ectopic      Multiple  0   Live Births  1            Home Medications    Prior to Admission medications   Medication Sig Start Date End Date Taking? Authorizing Provider  benzonatate (TESSALON) 100 MG capsule Take 1 capsule (100 mg total) by mouth every 8 (eight) hours. 08/23/20   Hall-Potvin, [place], PA-C  ibuprofen (ADVIL,MOTRIN) 100 MG/5ML suspension Take 20 mLs (400 mg total) by mouth every 6 (six) hours as needed. 09/14/16   Shary Decamp, PA-C  mupirocin ointment (BACTROBAN) 2 % Apply 1 application topically 2 (two) times daily. 08/13/19   Ok Edwards, PA-C    Family History Family History  Problem Relation Age of Onset  . Anemia Mother   . Hypertension Father   . Hypertension Sister   . Anemia Sister   . Anemia Brother   . Anemia Maternal Aunt   . Anemia Maternal Grandmother   . Diabetes Paternal Grandmother   . Hypertension Paternal Grandmother     Social History Social History   Tobacco Use  . Smoking status: Never Smoker  . Smokeless tobacco: Never Used  Vaping Use  . Vaping Use: Never used  Substance Use Topics  . Alcohol use: Yes    Comment: occ  . Drug use: No     Allergies   Flagyl  [metronidazole]   Review of Systems As per HPI   Physical Exam Triage Vital Signs ED Triage Vitals  Enc Vitals Group     BP 08/23/20 1313 128/86     Pulse Rate 08/23/20 1313 (!) 101     Resp 08/23/20 1313 20     Temp 08/23/20 1313 98.3 F (36.8 C)     Temp Source 08/23/20 1313 Oral     SpO2 08/23/20 1313 98 %     Weight --      Height --      Head Circumference --      Peak Flow --      Pain Score 08/23/20 1311 0     Pain Loc --      Pain Edu? --      Excl. in [place]? --    No data found.  Updated Vital Signs BP 128/86 (BP Location: Right Arm)   Pulse (!) 101   Temp 98.3 F (36.8 C) (Oral)   Resp 20   LMP 08/04/2020  SpO2 98%   Visual Acuity Right Eye Distance:   Left Eye Distance:   Bilateral Distance:    Right Eye Near:   Left Eye Near:    Bilateral Near:     Physical Exam Constitutional:      General: She is not in acute distress.    Appearance: She is obese. She is not ill-appearing or diaphoretic.  HENT:     Head: Normocephalic and atraumatic.     Mouth/Throat:     Mouth: Mucous membranes are moist.     Pharynx: Oropharynx is clear. No oropharyngeal exudate or posterior oropharyngeal erythema.  Eyes:     General: No scleral icterus.    Conjunctiva/sclera: Conjunctivae normal.     Pupils: Pupils are equal, round, and reactive to light.  Neck:     Comments: Trachea midline, negative JVD Cardiovascular:     Rate and Rhythm: Normal rate and regular rhythm.     Heart sounds: No murmur heard.  No gallop.      Comments: HR 93 at bedside Pulmonary:     Effort: Pulmonary effort is normal. No respiratory distress.     Breath sounds: No wheezing, rhonchi or rales.  Musculoskeletal:     Cervical back: Neck supple. No tenderness.  Lymphadenopathy:     Cervical: No cervical adenopathy.  Skin:    Capillary Refill: Capillary refill takes less than 2 seconds.     Coloration: Skin is not jaundiced or pale.     Findings: No rash.  Neurological:      General: No focal deficit present.     Mental Status: She is alert and oriented to person, place, and time.      UC Treatments / Results  Labs (all labs ordered are listed, but only abnormal results are displayed) Labs Reviewed  NOVEL CORONAVIRUS, NAA    EKG   Radiology No results found.  Procedures Procedures (including critical care time)  Medications Ordered in UC Medications - No data to display  Initial Impression / Assessment and Plan / UC Course  I have reviewed the triage vital signs and the nursing notes.  Pertinent labs & imaging results that were available during my care of the patient were reviewed by me and considered in my medical decision making (see chart for details).     Patient afebrile, nontoxic, with SpO2 98%.  Covid PCR pending.  Patient to quarantine until results are back.  We will treat supportively as outlined below.  Return precautions discussed, patient verbalized understanding and is agreeable to plan. Final Clinical Impressions(s) / UC Diagnoses   Final diagnoses:  Cough     Discharge Instructions     Tessalon for cough. Start flonase, atrovent nasal spray for nasal congestion/drainage. You can use over the counter nasal saline rinse such as neti pot for nasal congestion. Keep hydrated, your urine should be clear to pale yellow in color. Tylenol/motrin for fever and pain. Monitor for any worsening of symptoms, chest pain, shortness of breath, wheezing, swelling of the throat, go to the emergency department for further evaluation needed.     ED Prescriptions    Medication Sig Dispense Auth. Provider   benzonatate (TESSALON) 100 MG capsule Take 1 capsule (100 mg total) by mouth every 8 (eight) hours. 21 capsule Hall-Potvin, [place], PA-C     PDMP not reviewed this encounter.   Hall-Potvin, [place], [place] 08/23/20 1542

## 2020-08-23 NOTE — Discharge Instructions (Addendum)

## 2020-08-25 LAB — SARS-COV-2, NAA 2 DAY TAT

## 2020-08-25 LAB — NOVEL CORONAVIRUS, NAA: SARS-CoV-2, NAA: DETECTED — AB

## 2021-09-03 ENCOUNTER — Emergency Department (HOSPITAL_BASED_OUTPATIENT_CLINIC_OR_DEPARTMENT_OTHER)
Admission: EM | Admit: 2021-09-03 | Discharge: 2021-09-04 | Disposition: A | Discharge status: Home / Self Care | Payer: Medicaid Other | Attending: Emergency Medicine | Admitting: Emergency Medicine

## 2021-09-03 ENCOUNTER — Encounter (HOSPITAL_BASED_OUTPATIENT_CLINIC_OR_DEPARTMENT_OTHER): Payer: Self-pay | Admitting: Emergency Medicine

## 2021-09-03 ENCOUNTER — Other Ambulatory Visit: Payer: Self-pay

## 2021-09-03 ENCOUNTER — Emergency Department (HOSPITAL_BASED_OUTPATIENT_CLINIC_OR_DEPARTMENT_OTHER): Payer: Medicaid Other

## 2021-09-03 DIAGNOSIS — O26899 Other specified pregnancy related conditions, unspecified trimester: Secondary | ICD-10-CM

## 2021-09-03 DIAGNOSIS — I10 Essential (primary) hypertension: Secondary | ICD-10-CM | POA: Insufficient documentation

## 2021-09-03 DIAGNOSIS — O26891 Other specified pregnancy related conditions, first trimester: Secondary | ICD-10-CM | POA: Diagnosis not present

## 2021-09-03 DIAGNOSIS — Z3A01 Less than 8 weeks gestation of pregnancy: Secondary | ICD-10-CM | POA: Diagnosis not present

## 2021-09-03 DIAGNOSIS — R102 Pelvic and perineal pain: Secondary | ICD-10-CM | POA: Insufficient documentation

## 2021-09-03 DIAGNOSIS — O219 Vomiting of pregnancy, unspecified: Secondary | ICD-10-CM | POA: Insufficient documentation

## 2021-09-03 LAB — URINALYSIS, ROUTINE W REFLEX MICROSCOPIC
Bilirubin Urine: NEGATIVE
Glucose, UA: NEGATIVE mg/dL
Hgb urine dipstick: NEGATIVE
Ketones, ur: >80 mg/dL — AB
Nitrite: NEGATIVE
Protein, ur: 30 mg/dL — AB
Specific Gravity, Urine: 1.027 (ref 1.005–1.030)
pH: 6.5 (ref 5.0–8.0)

## 2021-09-03 LAB — CBC WITH DIFFERENTIAL/PLATELET
Abs Immature Granulocytes: 0.01 10*3/uL (ref 0.00–0.07)
Basophils Absolute: 0 10*3/uL (ref 0.0–0.1)
Basophils Relative: 0 %
Eosinophils Absolute: 0.1 10*3/uL (ref 0.0–0.5)
Eosinophils Relative: 2 %
HCT: 35.9 % — ABNORMAL LOW (ref 36.0–46.0)
Hemoglobin: 11.5 g/dL — ABNORMAL LOW (ref 12.0–15.0)
Immature Granulocytes: 0 %
Lymphocytes Relative: 19 %
Lymphs Abs: 1.3 10*3/uL (ref 0.7–4.0)
MCH: 26.1 pg (ref 26.0–34.0)
MCHC: 32 g/dL (ref 30.0–36.0)
MCV: 81.4 fL (ref 80.0–100.0)
Monocytes Absolute: 0.7 10*3/uL (ref 0.1–1.0)
Monocytes Relative: 10 %
Neutro Abs: 4.8 10*3/uL (ref 1.7–7.7)
Neutrophils Relative %: 69 %
Platelets: 414 10*3/uL — ABNORMAL HIGH (ref 150–400)
RBC: 4.41 MIL/uL (ref 3.87–5.11)
RDW: 14.7 % (ref 11.5–15.5)
WBC: 7 10*3/uL (ref 4.0–10.5)
nRBC: 0 % (ref 0.0–0.2)

## 2021-09-03 LAB — BASIC METABOLIC PANEL
Anion gap: 11 (ref 5–15)
BUN: 6 mg/dL (ref 6–20)
CO2: 22 mmol/L (ref 22–32)
Calcium: 9.5 mg/dL (ref 8.9–10.3)
Chloride: 100 mmol/L (ref 98–111)
Creatinine, Ser: 0.64 mg/dL (ref 0.44–1.00)
GFR, Estimated: >60 mL/min (ref >60)
Glucose, Bld: 85 mg/dL (ref 70–99)
Potassium: 3.6 mmol/L (ref 3.5–5.1)
Sodium: 133 mmol/L — ABNORMAL LOW (ref 135–145)

## 2021-09-03 LAB — HCG, QUANTITATIVE, PREGNANCY: hCG, Beta Chain, Quant, S: 138615 m[IU]/mL — ABNORMAL HIGH (ref <5)

## 2021-09-03 MED ORDER — LACTATED RINGERS IV BOLUS
1000.0000 mL | Freq: Once | INTRAVENOUS | Status: AC
  Administered 2021-09-03: 1000 mL via INTRAVENOUS

## 2021-09-03 MED ORDER — ONDANSETRON HCL 4 MG/2ML IJ SOLN
4.0000 mg | Freq: Once | INTRAMUSCULAR | Status: AC
  Administered 2021-09-03: 4 mg via INTRAVENOUS
  Filled 2021-09-03: qty 2

## 2021-09-03 MED ORDER — ONDANSETRON 4 MG PO TBDP: 4.0000 mg | ORAL_TABLET | Freq: Three times a day (TID) | ORAL | 0 refills | Status: DC | PRN

## 2021-09-03 NOTE — ED Notes (Signed)
Lr stopped for pt to go to ultrasound

## 2021-09-03 NOTE — ED Provider Notes (Signed)
[facility] Provider Note  CSN: [id number] Arrival date & time: 09/03/21 1715    History Chief Complaint  Patient presents with   Emesis During Pregnancy    Melanie Lopez is a 30 y.o. female G2P1 at approx 9wks reports she has had 2 weeks of nausea and 2 days of vomiting, similar to her first pregnancy. She reports some lower abdominal cramping pain. She denied vaginal bleeding to triage, but reports spotting since 6 weeks. She denies any fevers. Last emesis was prior to arrival. She saw a small amount of blood in her emesis.    Past Medical History:  Diagnosis Date   Anemia    Hypertension     Past Surgical History:  Procedure Laterality Date   NO PAST SURGERIES      Family History  Problem Relation Age of Onset   Anemia Mother    Hypertension Father    Hypertension Sister    Anemia Sister    Anemia Brother    Anemia Maternal Aunt    Anemia Maternal Grandmother    Diabetes Paternal Grandmother    Hypertension Paternal Grandmother     Social History   Tobacco Use   Smoking status: Never   Smokeless tobacco: Never  Vaping Use   Vaping Use: Never used  Substance Use Topics   Alcohol use: Yes    Comment: occ   Drug use: No     Home Medications Prior to Admission medications   Medication Sig Start Date End Date Taking? Authorizing Provider  ondansetron (ZOFRAN ODT) 4 MG disintegrating tablet Take 1 tablet (4 mg total) by mouth every 8 (eight) hours as needed for nausea or vomiting. 09/03/21  Yes Truddie Hidden, MD  benzonatate (TESSALON) 100 MG capsule Take 1 capsule (100 mg total) by mouth every 8 (eight) hours. 08/23/20   Hall-Potvin, [place], PA-C  ibuprofen (ADVIL,MOTRIN) 100 MG/5ML suspension Take 20 mLs (400 mg total) by mouth every 6 (six) hours as needed. 09/14/16   Shary Decamp, PA-C  mupirocin ointment (BACTROBAN) 2 % Apply 1 application topically 2 (two) times daily. 08/13/19   Tasia Catchings, Amy V, PA-C     Allergies     Flagyl [metronidazole]   Review of Systems   Review of Systems A comprehensive review of systems was completed and negative except as noted in HPI.    Physical Exam BP 119/65   Pulse 81   Temp 98.6 F (37 C) (Oral)   Resp 18   Ht 5\' 6"  (1.676 m)   Wt 111.1 kg   SpO2 100%   BMI 39.54 kg/m   Physical Exam Vitals and nursing note reviewed.  Constitutional:      Appearance: Normal appearance.  HENT:     Head: Normocephalic and atraumatic.     Nose: Nose normal.     Mouth/Throat:     Mouth: Mucous membranes are moist.  Eyes:     Extraocular Movements: Extraocular movements intact.     Conjunctiva/sclera: Conjunctivae normal.  Cardiovascular:     Rate and Rhythm: Normal rate.  Pulmonary:     Effort: Pulmonary effort is normal.     Breath sounds: Normal breath sounds.  Abdominal:     General: Abdomen is flat.     Palpations: Abdomen is soft.     Tenderness: There is abdominal tenderness (diffuse lower abdomen).  Musculoskeletal:        General: No swelling. Normal range of motion.     Cervical back: Neck supple.  Skin:    General: Skin is warm and dry.  Neurological:     General: No focal deficit present.     Mental Status: She is alert.  Psychiatric:        Mood and Affect: Mood normal.     ED Results / Procedures / Treatments   Labs (all labs ordered are listed, but only abnormal results are displayed) Labs Reviewed  CBC WITH DIFFERENTIAL/PLATELET - Abnormal; Notable for the following components:      Result Value   Hemoglobin 11.5 (*)    HCT 35.9 (*)    Platelets 414 (*)    All other components within normal limits  BASIC METABOLIC PANEL - Abnormal; Notable for the following components:   Sodium 133 (*)    All other components within normal limits  URINALYSIS, ROUTINE W REFLEX MICROSCOPIC - Abnormal; Notable for the following components:   Ketones, ur >80 (*)    Protein, ur 30 (*)    Leukocytes,Ua TRACE (*)    Bacteria, UA RARE (*)    All other  components within normal limits  HCG, QUANTITATIVE, PREGNANCY - Abnormal; Notable for the following components:   hCG, Beta Chain, Quant, S [id number] (*)    All other components within normal limits    EKG None   Radiology US OB LESS THAN 14 WEEKS WITH OB TRANSVAGINAL  Result Date: 09/03/2021 CLINICAL DATA:  Nausea, vomiting, abdominal pain. Pregnant. LMP 06/29/2021 EXAM: OBSTETRIC <14 WK [place] AND TRANSVAGINAL OB US TECHNIQUE: Both transabdominal and transvaginal ultrasound examinations were performed for complete evaluation of the gestation as well as the maternal uterus, adnexal regions, and pelvic cul-de-sac. Transvaginal technique was performed to assess early pregnancy. COMPARISON:  None. FINDINGS: Intrauterine gestational sac: Present, single Yolk sac:  Present, single, normal-appearing Embryo:  Present, single Cardiac Activity: Present, regular Heart Rate: 187 bpm MSD: Appropriate given fetal size CRL:  2 mm   8 w   6 d                  [place] EDC: 04/09/2022 Subchorionic hemorrhage:  None visualized. Maternal uterus/adnexae: The uterus is anteverted. A a subserosal/exophytic hypoechoic mass is seen arising from the posterior fundus measuring 2.9 x 2.8 x 2.7 cm most in keeping with a exophytic uterine fibroid. The cervix is closed and is unremarkable. Corpus luteum noted within the right ovary. The maternal ovaries are otherwise unremarkable. Trace simple appearing free fluid is seen within the cul-de-sac IMPRESSION: Single living intrauterine gestation with an estimated gestational age of [redacted] weeks, 6 days. No acute abnormality. Electronically Signed   By: Fidela Salisbury M.D.   On: 09/03/2021 22:57    Procedures Procedures  Medications Ordered in the ED Medications  lactated ringers bolus 1,000 mL (1,000 mLs Intravenous New Bag/Given 09/03/21 2205)  ondansetron [facility]) injection 4 mg (4 mg Intravenous Given 09/03/21 2204)     MDM Rules/Calculators/A&P MDM Labs ordered in triage unremarkable.  She has not seen Ob or had [place] for this pregnancy yet and is having lower abdominal pain/tenderness. Will add quant, check [place]. Zofran and IVF for symptom control.   ED Course  I have reviewed the triage vital signs and the nursing notes.  Pertinent labs & imaging results that were available during my care of the patient were reviewed by me and considered in my medical decision making (see chart for details).  Clinical Course as of 09/03/21 2317  Mon Sep 03, 2021  2211 UA with ketones consistent with vomiting.  No signs of infection.  [CS]  2306 [place] confirms IUP. Patient reports she is feeling better after zofran, tolerating PO fluids. IVF infusing now then plan discharge home with Rx for Zofran and Ob follow up. Patient amenable to this plan [CS]    Clinical Course User Index [CS] Truddie Hidden, MD    Final Clinical Impression(s) / ED Diagnoses Final diagnoses:  Pelvic pain affecting pregnancy  Vomiting pregnancy    Rx / DC Orders ED Discharge Orders          Ordered    ondansetron (ZOFRAN ODT) 4 MG disintegrating tablet  Every 8 hours PRN        09/03/21 2307             Truddie Hidden, MD 09/03/21 2317

## 2021-09-03 NOTE — ED Triage Notes (Addendum)
Pt arrives to ED with c/o emesis. Pt reports that yesterday she vomited once and today 3 times. Pt states that today she experienced x1 episode of bright red blood in her vomit. Pt reports that she developed upper abdominal pain this morning followed by lower abdominal pain this afternoon. No vaginal bleeding noted. Pt denies fevers, chills, chest tightness, SOB. Pt reports that she is [redacted] weeks pregnant.

## 2021-09-10 ENCOUNTER — Other Ambulatory Visit: Payer: Self-pay

## 2021-09-10 LAB — OB RESULTS CONSOLE GC/CHLAMYDIA
Chlamydia: NEGATIVE
Gonorrhea: NEGATIVE

## 2021-09-10 LAB — OB RESULTS CONSOLE RPR: RPR: NONREACTIVE

## 2021-09-10 LAB — AMB REFERRAL TO OB-GYN
Drug Screen, Urine: NEGATIVE
Glucose, 1 hour: 116

## 2021-09-10 LAB — OB RESULTS CONSOLE HIV ANTIBODY (ROUTINE TESTING): HIV: NONREACTIVE

## 2021-09-10 LAB — OB RESULTS CONSOLE HEPATITIS B SURFACE ANTIGEN: Hepatitis B Surface Ag: NEGATIVE

## 2021-09-10 LAB — OB RESULTS CONSOLE RUBELLA ANTIBODY, IGM: Rubella: IMMUNE

## 2021-09-10 LAB — HEPATITIS C ANTIBODY: HCV Ab: NEGATIVE

## 2021-09-17 ENCOUNTER — Other Ambulatory Visit: Payer: Self-pay

## 2021-09-28 ENCOUNTER — Other Ambulatory Visit: Payer: Self-pay

## 2021-09-28 ENCOUNTER — Encounter (HOSPITAL_COMMUNITY): Payer: Self-pay | Admitting: Obstetrics and Gynecology

## 2021-09-28 ENCOUNTER — Inpatient Hospital Stay (HOSPITAL_COMMUNITY)
Admission: AD | Admit: 2021-09-28 | Discharge: 2021-09-28 | Disposition: A | Discharge status: Home / Self Care | Payer: Medicaid Other | Attending: Obstetrics and Gynecology | Admitting: Obstetrics and Gynecology

## 2021-09-28 DIAGNOSIS — O10911 Unspecified pre-existing hypertension complicating pregnancy, first trimester: Secondary | ICD-10-CM | POA: Diagnosis not present

## 2021-09-28 DIAGNOSIS — Z881 Allergy status to other antibiotic agents status: Secondary | ICD-10-CM | POA: Diagnosis not present

## 2021-09-28 DIAGNOSIS — O210 Mild hyperemesis gravidarum: Secondary | ICD-10-CM | POA: Diagnosis not present

## 2021-09-28 DIAGNOSIS — Z3A13 13 weeks gestation of pregnancy: Secondary | ICD-10-CM | POA: Diagnosis not present

## 2021-09-28 DIAGNOSIS — O219 Vomiting of pregnancy, unspecified: Secondary | ICD-10-CM | POA: Insufficient documentation

## 2021-09-28 DIAGNOSIS — Z8249 Family history of ischemic heart disease and other diseases of the circulatory system: Secondary | ICD-10-CM | POA: Diagnosis not present

## 2021-09-28 LAB — URINALYSIS, ROUTINE W REFLEX MICROSCOPIC
Bilirubin Urine: NEGATIVE
Glucose, UA: NEGATIVE mg/dL
Hgb urine dipstick: NEGATIVE
Ketones, ur: 5 mg/dL — AB
Nitrite: NEGATIVE
Protein, ur: 30 mg/dL — AB
Specific Gravity, Urine: 1.031 — ABNORMAL HIGH (ref 1.005–1.030)
pH: 6 (ref 5.0–8.0)

## 2021-09-28 MED ORDER — FAMOTIDINE 20 MG PO TABS
20.0000 mg | ORAL_TABLET | Freq: Once | ORAL | Status: AC
  Administered 2021-09-28: 20 mg via ORAL
  Filled 2021-09-28: qty 1

## 2021-09-28 MED ORDER — ONDANSETRON 4 MG PO TBDP: 4.0000 mg | ORAL_TABLET | Freq: Three times a day (TID) | ORAL | 2 refills | Status: DC | PRN

## 2021-09-28 MED ORDER — ONDANSETRON 4 MG PO TBDP
8.0000 mg | ORAL_TABLET | Freq: Once | ORAL | Status: AC
  Administered 2021-09-28: 8 mg via ORAL
  Filled 2021-09-28: qty 2

## 2021-09-28 MED ORDER — FAMOTIDINE 20 MG PO TABS: 20.0000 mg | ORAL_TABLET | Freq: Two times a day (BID) | ORAL | 1 refills | Status: DC

## 2021-09-28 NOTE — MAU Note (Signed)
Presents c/o N/V, states unable to keep anything down since yesterday.  Reports was taking Zofran, but ran out of her prescription.  Denies VB or LOF.  Currently on antibiotics for yeast infection.

## 2021-09-28 NOTE — MAU Provider Note (Signed)
History     CSN: [id number]  Arrival date and time: 09/28/21 0900   Event Date/Time   First Provider Initiated Contact with Patient 09/28/21 1004      Chief Complaint  Patient presents with   Emesis   Nausea   HPI  [name] is a 30 y.o. female G2P1001 @[redacted]w[redacted]d   here in MAU with complaints of Nausea and vomiting. She reports she started vomiting last night. She has Zofran at home, however did not take it because she only has one tablet left. She attempted to drink water and applesauce and through it up. She reports the N/V come and goes, but the last 24 hours has been worse.   OB History     Gravida  2   Para  1   Term  1   Preterm      AB      Living  1      SAB      IAB      Ectopic      Multiple  0   Live Births  1           Past Medical History:  Diagnosis Date   Anemia    Hypertension     Past Surgical History:  Procedure Laterality Date   NO PAST SURGERIES      Family History  Problem Relation Age of Onset   Anemia Mother    Cancer Father    Hypertension Father    Hypertension Sister    Anemia Sister    Anemia Brother    Anemia Maternal Aunt    Anemia Maternal Grandmother    Diabetes Paternal Grandmother    Hypertension Paternal Grandmother     Social History   Tobacco Use   Smoking status: Never   Smokeless tobacco: Never  Vaping Use   Vaping Use: Never used  Substance Use Topics   Alcohol use: Yes    Comment: occ   Drug use: No    Allergies:  Allergies  Allergen Reactions   Flagyl [Metronidazole] Hives and Rash    Medications Prior to Admission  Medication Sig Dispense Refill Last Dose   Prenatal Vit-Fe Fumarate-FA (MULTIVITAMIN-PRENATAL) 27-0.8 MG TABS tablet Take 1 tablet by mouth daily at 12 noon.   09/27/2021   benzonatate (TESSALON) 100 MG capsule Take 1 capsule (100 mg total) by mouth every 8 (eight) hours. 21 capsule 0    ibuprofen (ADVIL,MOTRIN) 100 MG/5ML suspension Take 20 mLs (400 mg total)  by mouth every 6 (six) hours as needed. 473 mL 0    mupirocin ointment (BACTROBAN) 2 % Apply 1 application topically 2 (two) times daily. 30 g 0    ondansetron (ZOFRAN ODT) 4 MG disintegrating tablet Take 1 tablet (4 mg total) by mouth every 8 (eight) hours as needed for nausea or vomiting. 20 tablet 0 09/26/2021   Results for orders placed or performed during the hospital encounter of 09/28/21 (from the past 24 hour(s))  Urinalysis, Routine w reflex microscopic Urine, Clean Catch     Status: Abnormal   Collection Time: 09/28/21  9:37 AM  Result Value Ref Range   Color, Urine AMBER (A) YELLOW   APPearance CLOUDY (A) CLEAR   Specific Gravity, Urine 1.031 (H) 1.005 - 1.030   pH 6.0 5.0 - 8.0   Glucose, UA NEGATIVE NEGATIVE mg/dL   Hgb urine dipstick NEGATIVE NEGATIVE   Bilirubin Urine NEGATIVE NEGATIVE   Ketones, ur 5 (A) NEGATIVE mg/dL   Protein,  ur 30 (A) NEGATIVE mg/dL   Nitrite NEGATIVE NEGATIVE   Leukocytes,Ua TRACE (A) NEGATIVE   RBC / HPF 0-5 0 - 5 RBC/hpf   WBC, UA 0-5 0 - 5 WBC/hpf   Bacteria, UA RARE (A) NONE SEEN   Squamous Epithelial / LPF 11-20 0 - 5   Mucus PRESENT      Review of Systems  Gastrointestinal:  Positive for abdominal pain, nausea and vomiting. Negative for diarrhea.  Physical Exam   Blood pressure 136/83, pulse 80, temperature 98.2 F (36.8 C), temperature source Oral, resp. rate 18, height 5\' 6"  (1.676 m), weight 106.9 kg, SpO2 99 %.  Physical Exam Vitals and nursing note reviewed.  Constitutional:      General: She is not in acute distress.    Appearance: Normal appearance. She is not ill-appearing, toxic-appearing or diaphoretic.  HENT:     Head: Normocephalic.  Musculoskeletal:        General: Normal range of motion.  Neurological:     Mental Status: She is alert and oriented to person, place, and time.  Psychiatric:        Behavior: Behavior normal.   MAU Course  Procedures None  MDM  UA shows ketones 5 Zofran and Pepcid given  orally Patient tolerated oral fluids    A:  1. Nausea and vomiting in pregnancy   2. [redacted] weeks gestation of pregnancy      P:  Discharge home in stable condition Rx: Zofran & Pepcid  Small, frequent meals Increase oral fluid intake  Alem Fahl, Anderson Malta I, NP 09/28/2021 2:01 PM

## 2021-10-04 ENCOUNTER — Ambulatory Visit (INDEPENDENT_AMBULATORY_CARE_PROVIDER_SITE_OTHER): Payer: Medicaid Other | Admitting: Obstetrics & Gynecology

## 2021-10-04 ENCOUNTER — Other Ambulatory Visit: Payer: Self-pay

## 2021-10-04 VITALS — BP 126/82 | HR 109 | Wt 230.6 lb

## 2021-10-04 DIAGNOSIS — Z3A13 13 weeks gestation of pregnancy: Secondary | ICD-10-CM

## 2021-10-04 DIAGNOSIS — Z5941 Food insecurity: Secondary | ICD-10-CM

## 2021-10-04 DIAGNOSIS — O0990 Supervision of high risk pregnancy, unspecified, unspecified trimester: Secondary | ICD-10-CM | POA: Insufficient documentation

## 2021-10-04 DIAGNOSIS — O10011 Pre-existing essential hypertension complicating pregnancy, first trimester: Secondary | ICD-10-CM

## 2021-10-04 LAB — CBC
Hematocrit: 35.7 % (ref 34.0–46.6)
Hemoglobin: 11.5 g/dL (ref 11.1–15.9)
MCH: 26.4 pg — ABNORMAL LOW (ref 26.6–33.0)
MCHC: 32.2 g/dL (ref 31.5–35.7)
MCV: 82 fL (ref 79–97)
Platelets: 398 10*3/uL (ref 150–450)
RBC: 4.36 x10E6/uL (ref 3.77–5.28)
RDW: 14.7 % (ref 11.7–15.4)
WBC: 5.8 10*3/uL (ref 3.4–10.8)

## 2021-10-04 LAB — COMPREHENSIVE METABOLIC PANEL
ALT: 30 IU/L (ref 0–32)
AST: 18 IU/L (ref 0–40)
Albumin/Globulin Ratio: 1.1 — ABNORMAL LOW (ref 1.2–2.2)
Albumin: 3.7 g/dL — ABNORMAL LOW (ref 3.9–5.0)
Alkaline Phosphatase: 67 IU/L (ref 44–121)
BUN/Creatinine Ratio: 7 — ABNORMAL LOW (ref 9–23)
BUN: 5 mg/dL — ABNORMAL LOW (ref 6–20)
Bilirubin Total: <0.2 mg/dL (ref 0.0–1.2)
CO2: 20 mmol/L (ref 20–29)
Calcium: 9.3 mg/dL (ref 8.7–10.2)
Chloride: 99 mmol/L (ref 96–106)
Creatinine, Ser: 0.68 mg/dL (ref 0.57–1.00)
Globulin, Total: 3.3 g/dL (ref 1.5–4.5)
Glucose: 86 mg/dL (ref 70–99)
Potassium: 3.8 mmol/L (ref 3.5–5.2)
Sodium: 134 mmol/L (ref 134–144)
Total Protein: 7 g/dL (ref 6.0–8.5)
eGFR: 122 mL/min/{1.73_m2} (ref >59)

## 2021-10-04 MED ORDER — BLOOD PRESSURE KIT DEVI: 1.0000 | 0 refills | Status: DC

## 2021-10-04 MED ORDER — ASPIRIN EC 81 MG PO TBEC
81.0000 mg | DELAYED_RELEASE_TABLET | Freq: Every day | ORAL | 2 refills | Status: DC
Start: 2021-10-04 — End: 2021-11-09

## 2021-10-04 MED ORDER — MAGNESIUM 125 MG PO CAPS: 1.0000 | ORAL_CAPSULE | Freq: Every day | ORAL | 3 refills | Status: DC

## 2021-10-04 NOTE — Progress Notes (Signed)
  Subjective:transfer from HD for HTN    Melanie Lopez is a G2P1001 [redacted]w[redacted]d being seen today for her first obstetrical visit.  Her obstetrical history is significant for  suspected CHTN at HD . Patient does intend to breast feed. Pregnancy history fully reviewed.  Patient reports nausea.  Vitals:   10/04/21 0843  BP: 126/82  Pulse: (!) 109  Weight: 230 lb 9.6 oz (104.6 kg)    HISTORY: OB History  Gravida Para Term Preterm AB Living  2 1 1     1   SAB IAB Ectopic Multiple Live Births        0 1    # Outcome Date GA Lbr Len/2nd Weight Sex Delivery Anes PTL Lv  2 Current           1 Term 08/08/16 [redacted]w[redacted]d 32:45 / 04:23 6 lb 15.1 oz (3.15 kg) M Vag-Spont EPI  LIV   Past Medical History:  Diagnosis Date   Anemia    Hypertension    Past Surgical History:  Procedure Laterality Date   NO PAST SURGERIES     Family History  Problem Relation Age of Onset   Anemia Mother    Cancer Father    Hypertension Father    Hypertension Sister    Anemia Sister    Anemia Brother    Anemia Maternal Aunt    Anemia Maternal Grandmother    Diabetes Paternal Grandmother    Hypertension Paternal Grandmother      Exam    Uterus:     Pelvic Exam:    Perineum:    Vulva:    Vagina:     pH:    Cervix:    Adnexa:    Bony Pelvis:   System: Breast:     Skin: normal coloration and turgor, no rashes    Neurologic: oriented, normal mood   Extremities: normal strength, tone, and muscle mass   HEENT PERRLA   Mouth/Teeth mucous membranes moist, pharynx normal without lesions   Neck supple   Cardiovascular: regular rate and rhythm   Respiratory:  appears well, vitals normal, no respiratory distress, acyanotic, normal RR   Abdomen:    Urinary:       Assessment:    Pregnancy: G2P1001 Patient Active Problem List   Diagnosis Date Noted   Supervision of high risk pregnancy, antepartum 10/04/2021   Post-dates pregnancy 08/07/2016        Plan:     Initial labs reviewed  Prenatal  vitamins. Problem list reviewed and updated. Genetic Screening discussed : results reviewed.  Ultrasound discussed; fetal survey: ordered.  Follow up in 4 weeks. 50% of 30 min visit spent on counseling and coordination of care.  Magnesium for leg cramps. ASA 81 mg, Pro Cr ordered today   Emeterio Reeve 10/04/2021

## 2021-10-04 NOTE — Progress Notes (Signed)
Detailed anatomy ultrasound (Maternal fetal medicine) has been scheduled for December 2, 22 at 9:30am.Patient has been informed about the appointment and she verbalized understanding. There were no questions or concerns.  Zella Richer, [place]   10/04/21

## 2021-10-05 LAB — PROTEIN / CREATININE RATIO, URINE
Creatinine, Urine: 424 mg/dL
Protein, Ur: 67.7 mg/dL
Protein/Creat Ratio: 160 mg/g creat (ref 0–200)

## 2021-10-29 ENCOUNTER — Inpatient Hospital Stay (HOSPITAL_COMMUNITY)
Admission: AD | Admit: 2021-10-29 | Discharge: 2021-10-29 | Disposition: A | Discharge status: Home / Self Care | Payer: Medicaid Other | Attending: Obstetrics and Gynecology | Admitting: Obstetrics and Gynecology

## 2021-10-29 ENCOUNTER — Other Ambulatory Visit: Payer: Self-pay

## 2021-10-29 ENCOUNTER — Encounter (HOSPITAL_COMMUNITY): Payer: Self-pay | Admitting: Obstetrics and Gynecology

## 2021-10-29 DIAGNOSIS — O99512 Diseases of the respiratory system complicating pregnancy, second trimester: Secondary | ICD-10-CM | POA: Diagnosis not present

## 2021-10-29 DIAGNOSIS — J101 Influenza due to other identified influenza virus with other respiratory manifestations: Secondary | ICD-10-CM | POA: Diagnosis present

## 2021-10-29 DIAGNOSIS — Z79899 Other long term (current) drug therapy: Secondary | ICD-10-CM | POA: Diagnosis not present

## 2021-10-29 DIAGNOSIS — Z3492 Encounter for supervision of normal pregnancy, unspecified, second trimester: Secondary | ICD-10-CM

## 2021-10-29 DIAGNOSIS — Z3A17 17 weeks gestation of pregnancy: Secondary | ICD-10-CM | POA: Insufficient documentation

## 2021-10-29 DIAGNOSIS — O10912 Unspecified pre-existing hypertension complicating pregnancy, second trimester: Secondary | ICD-10-CM | POA: Insufficient documentation

## 2021-10-29 DIAGNOSIS — Z20822 Contact with and (suspected) exposure to covid-19: Secondary | ICD-10-CM | POA: Insufficient documentation

## 2021-10-29 DIAGNOSIS — O0990 Supervision of high risk pregnancy, unspecified, unspecified trimester: Secondary | ICD-10-CM

## 2021-10-29 LAB — CBC
HCT: 34.4 % — ABNORMAL LOW (ref 36.0–46.0)
Hemoglobin: 11.5 g/dL — ABNORMAL LOW (ref 12.0–15.0)
MCH: 27.3 pg (ref 26.0–34.0)
MCHC: 33.4 g/dL (ref 30.0–36.0)
MCV: 81.7 fL (ref 80.0–100.0)
Platelets: 349 10*3/uL (ref 150–400)
RBC: 4.21 MIL/uL (ref 3.87–5.11)
RDW: 14.5 % (ref 11.5–15.5)
WBC: 8.7 10*3/uL (ref 4.0–10.5)
nRBC: 0 % (ref 0.0–0.2)

## 2021-10-29 LAB — URINALYSIS, ROUTINE W REFLEX MICROSCOPIC
Bilirubin Urine: NEGATIVE
Glucose, UA: NEGATIVE mg/dL
Hgb urine dipstick: NEGATIVE
Ketones, ur: 80 mg/dL — AB
Nitrite: NEGATIVE
Protein, ur: 30 mg/dL — AB
Specific Gravity, Urine: 1.026 (ref 1.005–1.030)
pH: 6 (ref 5.0–8.0)

## 2021-10-29 LAB — COMPREHENSIVE METABOLIC PANEL
ALT: 41 U/L (ref 0–44)
AST: 32 U/L (ref 15–41)
Albumin: 3 g/dL — ABNORMAL LOW (ref 3.5–5.0)
Alkaline Phosphatase: 52 U/L (ref 38–126)
Anion gap: 14 (ref 5–15)
BUN: <5 mg/dL — ABNORMAL LOW (ref 6–20)
CO2: 18 mmol/L — ABNORMAL LOW (ref 22–32)
Calcium: 9.1 mg/dL (ref 8.9–10.3)
Chloride: 100 mmol/L (ref 98–111)
Creatinine, Ser: 0.57 mg/dL (ref 0.44–1.00)
GFR, Estimated: >60 mL/min (ref >60)
Glucose, Bld: 92 mg/dL (ref 70–99)
Potassium: 3.4 mmol/L — ABNORMAL LOW (ref 3.5–5.1)
Sodium: 132 mmol/L — ABNORMAL LOW (ref 135–145)
Total Bilirubin: 0.5 mg/dL (ref 0.3–1.2)
Total Protein: 7.5 g/dL (ref 6.5–8.1)

## 2021-10-29 LAB — RESP PANEL BY RT-PCR (FLU A&B, COVID) ARPGX2
Influenza A by PCR: POSITIVE — AB
Influenza B by PCR: NEGATIVE
SARS Coronavirus 2 by RT PCR: NEGATIVE

## 2021-10-29 MED ORDER — PROMETHAZINE HCL 12.5 MG PO TABS: 12.5000 mg | ORAL_TABLET | Freq: Four times a day (QID) | ORAL | 0 refills | Status: DC | PRN

## 2021-10-29 MED ORDER — ACETAMINOPHEN 500 MG PO TABS
1000.0000 mg | ORAL_TABLET | Freq: Once | ORAL | Status: AC
  Administered 2021-10-29: 1000 mg via ORAL
  Filled 2021-10-29: qty 2

## 2021-10-29 MED ORDER — ACETAMINOPHEN 325 MG PO TABS: 650.0000 mg | ORAL_TABLET | ORAL | 0 refills | Status: DC | PRN

## 2021-10-29 MED ORDER — BENZONATATE 100 MG PO CAPS: 100.0000 mg | ORAL_CAPSULE | Freq: Three times a day (TID) | ORAL | 0 refills | Status: DC

## 2021-10-29 MED ORDER — LACTATED RINGERS IV BOLUS
1000.0000 mL | Freq: Once | INTRAVENOUS | Status: AC
  Administered 2021-10-29: 1000 mL via INTRAVENOUS

## 2021-10-29 MED ORDER — DEXTROMETHORPHAN POLISTIREX ER 30 MG/5ML PO SUER
30.0000 mg | Freq: Once | ORAL | Status: AC
  Administered 2021-10-29: 30 mg via ORAL
  Filled 2021-10-29: qty 5

## 2021-10-29 NOTE — MAU Provider Note (Signed)
History     CSN: [id number]  Arrival date and time: 10/29/21 1830   Event Date/Time   First Provider Initiated Contact with Patient 10/29/21 2016      Chief Complaint  Patient presents with   Cough   HPI Melanie Lopez is a 30 y.o. G2P1001 at 28w3dwho presents to MAU with chief complaint of non-productive cough. She is also experiencing vomiting immediately after her coughing episodes, congestion, SOB and fever. She has not taken medication or tried other treatments for this complaint because she did not know which medications were safe in pregnancy. She denies activity intolerance, weakness, syncope. Patient did not receive a seasonal flu vaccine.  She states her fiance has pneumonia and her son is also sick but does not have a diagnosis.  [facility] [place] [facility] [place] [facility]  OB History     Gravida  2   Para  1   Term  1   Preterm      AB      Living  1      SAB      IAB      Ectopic      Multiple  0   Live Births  1           Past Medical History:  Diagnosis Date   Anemia    Hypertension    Post-dates pregnancy 08/07/2016    Past Surgical History:  Procedure Laterality Date   NO PAST SURGERIES      Family History  Problem Relation Age of Onset   Anemia Mother    Cancer Father    Hypertension Father    Hypertension Sister    Anemia Sister    Anemia Brother    Anemia Maternal Aunt    Anemia Maternal Grandmother    Diabetes Paternal Grandmother    Hypertension Paternal Grandmother     Social History   Tobacco Use   Smoking status: Never   Smokeless tobacco: Never  Vaping Use   Vaping Use: Never used  Substance Use Topics   Alcohol use: Not Currently    Comment: occ   Drug use: No    Allergies:  Allergies  Allergen Reactions   Flagyl [Metronidazole] Hives and Rash    Medications Prior to Admission  Medication Sig Dispense Refill Last Dose   famotidine (PEPCID) 20 MG tablet Take 1 tablet (20 mg total) by mouth 2 (two) times  daily. 60 tablet 1 10/29/2021 at 0700   ondansetron (ZOFRAN ODT) 4 MG disintegrating tablet Take 1 tablet (4 mg total) by mouth every 8 (eight) hours as needed for nausea or vomiting. 20 tablet 2 10/29/2021 at 0700   Prenatal Vit-Fe Fumarate-FA (MULTIVITAMIN-PRENATAL) 27-0.8 MG TABS tablet Take 1 tablet by mouth daily at 12 noon.   10/28/2021   aspirin EC 81 MG tablet Take 1 tablet (81 mg total) by mouth daily. Swallow whole. 100 tablet 2    benzonatate (TESSALON) 100 MG capsule Take 1 capsule (100 mg total) by mouth every 8 (eight) hours. (Patient not taking: Reported on 10/04/2021) 21 capsule 0    Blood Pressure Monitoring (BLOOD PRESSURE KIT) DEVI 1 each by Does not apply route once a week. 1 each 0    Magnesium 125 MG CAPS Take 1 capsule by mouth daily. 100 capsule 3    mupirocin ointment (BACTROBAN) 2 % Apply 1 application topically 2 (two) times daily. (Patient not taking: Reported on 10/04/2021) 30 g 0     Review of Systems  Constitutional:  Positive for fatigue and fever.  HENT:  Positive for congestion.   Respiratory:  Positive for cough.   All other systems reviewed and are negative. Physical Exam   Blood pressure 135/81, pulse (!) 113, temperature 100.2 F (37.9 C), temperature source Oral, resp. rate (!) 22, height 5' 6"  (1.676 m), weight 103.4 kg, SpO2 99 %.  Physical Exam Vitals and nursing note reviewed. Exam conducted with a chaperone present.  Constitutional:      Appearance: Normal appearance. She is ill-appearing.  Cardiovascular:     Rate and Rhythm: Regular rhythm. Tachycardia present.     Pulses: Normal pulses.     Heart sounds: Normal heart sounds.  Pulmonary:     Effort: Pulmonary effort is normal.     Breath sounds: Normal breath sounds.  Abdominal:     Comments: Gravid  Skin:    Capillary Refill: Capillary refill takes less than 2 seconds.  Neurological:     Mental Status: She is alert and oriented to person, place, and time.  Psychiatric:        Mood  and Affect: Mood normal.        Behavior: Behavior normal.        Thought Content: Thought content normal.        Judgment: Judgment normal.    MAU Course/MDM  Procedures  --72 hours since symptoms onset. Not a candidate for Tamiflu --Pertinent negatives: febrile in MAU, abnormal lung sounds, food intolerance, weakness --No episodes of vomiting during evaluation in MAU --80 ketones, s/p LR fluid bolus, vomiting well controlled, new rx Phenergan   Orders Placed This Encounter  Procedures   Resp Panel by RT-PCR (Flu A&B, Covid) Nasopharyngeal Swab   Comprehensive metabolic panel   CBC   Urinalysis, Routine w reflex microscopic Urine, Clean Catch   Discharge patient   Patient Vitals for the past 24 hrs:  BP Temp Temp src Pulse Resp SpO2 Height Weight  10/29/21 2206 126/66 98.4 F (36.9 C) Axillary 96 20 -- -- --  10/29/21 1951 135/81 -- -- (!) 113 -- -- -- --  10/29/21 1921 130/77 100.2 F (37.9 C) Oral (!) 116 (!) 22 99 % 5' 6"  (1.676 m) 103.4 kg   Results for orders placed or performed during the hospital encounter of 10/29/21 (from the past 24 hour(s))  Comprehensive metabolic panel     Status: Abnormal   Collection Time: 10/29/21  7:46 PM  Result Value Ref Range   Sodium 132 (L) 135 - 145 mmol/L   Potassium 3.4 (L) 3.5 - 5.1 mmol/L   Chloride 100 98 - 111 mmol/L   CO2 18 (L) 22 - 32 mmol/L   Glucose, Bld 92 70 - 99 mg/dL   BUN <5 (L) 6 - 20 mg/dL   Creatinine, Ser 0.57 0.44 - 1.00 mg/dL   Calcium 9.1 8.9 - 10.3 mg/dL   Total Protein 7.5 6.5 - 8.1 g/dL   Albumin 3.0 (L) 3.5 - 5.0 g/dL   AST 32 15 - 41 U/L   ALT 41 0 - 44 U/L   Alkaline Phosphatase 52 38 - 126 U/L   Total Bilirubin 0.5 0.3 - 1.2 mg/dL   GFR, Estimated >60 >60 mL/min   Anion gap 14 5 - 15  CBC     Status: Abnormal   Collection Time: 10/29/21  7:46 PM  Result Value Ref Range   WBC 8.7 4.0 - 10.5 K/uL   RBC 4.21 3.87 - 5.11 MIL/uL   Hemoglobin  11.5 (L) 12.0 - 15.0 g/dL   HCT 34.4 (L) 36.0 - 46.0 %    MCV 81.7 80.0 - 100.0 fL   MCH 27.3 26.0 - 34.0 pg   MCHC 33.4 30.0 - 36.0 g/dL   RDW 14.5 11.5 - 15.5 %   Platelets 349 150 - 400 K/uL   nRBC 0.0 0.0 - 0.2 %  Resp Panel by RT-PCR (Flu A&B, Covid) Nasopharyngeal Swab     Status: Abnormal   Collection Time: 10/29/21  7:52 PM   Specimen: Nasopharyngeal Swab; Nasopharyngeal(NP) swabs in vial transport medium  Result Value Ref Range   SARS Coronavirus 2 by RT PCR NEGATIVE NEGATIVE   Influenza A by PCR POSITIVE (A) NEGATIVE   Influenza B by PCR NEGATIVE NEGATIVE  Urinalysis, Routine w reflex microscopic Urine, Clean Catch     Status: Abnormal   Collection Time: 10/29/21  9:30 PM  Result Value Ref Range   Color, Urine YELLOW YELLOW   APPearance CLOUDY (A) CLEAR   Specific Gravity, Urine 1.026 1.005 - 1.030   pH 6.0 5.0 - 8.0   Glucose, UA NEGATIVE NEGATIVE mg/dL   Hgb urine dipstick NEGATIVE NEGATIVE   Bilirubin Urine NEGATIVE NEGATIVE   Ketones, ur 80 (A) NEGATIVE mg/dL   Protein, ur 30 (A) NEGATIVE mg/dL   Nitrite NEGATIVE NEGATIVE   Leukocytes,Ua LARGE (A) NEGATIVE   RBC / HPF 0-5 0 - 5 RBC/hpf   WBC, UA 21-50 0 - 5 WBC/hpf   Bacteria, UA FEW (A) NONE SEEN   Squamous Epithelial / LPF 21-50 0 - 5   Mucus PRESENT     Meds ordered this encounter  Medications   lactated ringers bolus 1,000 mL   acetaminophen (TYLENOL) tablet 1,000 mg   dextromethorphan (DELSYM) 30 MG/5ML liquid 30 mg   benzonatate (TESSALON) 100 MG capsule    Sig: Take 1 capsule (100 mg total) by mouth every 8 (eight) hours.    Dispense:  21 capsule    Refill:  0    Order Specific Question:   Supervising Provider    Answer:   Rip Harbour, MICHAEL L [[id number]]   acetaminophen (TYLENOL) 325 MG tablet    Sig: Take 2 tablets (650 mg total) by mouth every 4 (four) hours as needed for fever or headache.    Dispense:  240 tablet    Refill:  0    Order Specific Question:   Supervising Provider    Answer:   Rip Harbour, MICHAEL L [[id number]]   promethazine (PHENERGAN) 12.5 MG  tablet    Sig: Take 1 tablet (12.5 mg total) by mouth every 6 (six) hours as needed for nausea or vomiting.    Dispense:  30 tablet    Refill:  0    Order Specific Question:   Supervising Provider    Answer:   Rip Harbour, MICHAEL L [[id number]]   Assessment and Plan  --30 y.o. G2P1001 at [redacted]w[redacted]d --Flu A --FHT 166 by Doppler --Reviewed symptom management at home --Reviewed changes in acuity which would merit return to MAU --Discharge home in stable condition  SDarlina Rumpf CNM 10/29/2021, 10:49 PM

## 2021-10-29 NOTE — Discharge Instructions (Signed)

## 2021-10-29 NOTE — MAU Note (Signed)
PT SAYS SHE HAS BAD COUGH - STARTED ON SAT  PNC - WITH CLINIC - DID NOT CALL  VOMITING - TOOK ZOFRAN  AT 7A - NO HELP SAYS THIS IS NOT A NEW PROBLEM  HEAD / THROAT HURTS- STARTED YESTERDAY

## 2021-10-30 LAB — CULTURE, OB URINE

## 2021-11-05 ENCOUNTER — Ambulatory Visit (INDEPENDENT_AMBULATORY_CARE_PROVIDER_SITE_OTHER): Payer: Medicaid Other | Admitting: Obstetrics & Gynecology

## 2021-11-05 ENCOUNTER — Other Ambulatory Visit: Payer: Self-pay

## 2021-11-05 VITALS — BP 124/82 | HR 106 | Wt 222.1 lb

## 2021-11-05 DIAGNOSIS — O0990 Supervision of high risk pregnancy, unspecified, unspecified trimester: Secondary | ICD-10-CM

## 2021-11-05 NOTE — Progress Notes (Signed)
   PRENATAL VISIT NOTE  Subjective:  Melanie Lopez is a 30 y.o. G2P1001 at [redacted]w[redacted]d being seen today for ongoing prenatal care.  She is currently monitored for the following issues for this low-risk pregnancy and has Supervision of high risk pregnancy, antepartum on their problem list.  Patient reports no complaints.  Contractions: Not present. Vag. Bleeding: None.  Movement: Present. Denies leaking of fluid.   The following portions of the patient's history were reviewed and updated as appropriate: allergies, current medications, past family history, past medical history, past social history, past surgical history and problem list.   Objective:   Vitals:   11/05/21 1117  BP: 124/82  Pulse: (!) 106  Weight: 222 lb 1.6 oz (100.7 kg)    Fetal Status: Fetal Heart Rate (bpm): 145   Movement: Present     General:  Alert, oriented and cooperative. Patient is in no acute distress.  Skin: Skin is warm and dry. No rash noted.   Cardiovascular: Normal heart rate noted  Respiratory: Normal respiratory effort, no problems with respiration noted  Abdomen: Soft, gravid, appropriate for gestational age.  Pain/Pressure: Present     Pelvic: Cervical exam deferred        Extremities: Normal range of motion.  Edema: None  Mental Status: Normal mood and affect. Normal behavior. Normal judgment and thought content.   Assessment and Plan:  Pregnancy: G2P1001 at [redacted]w[redacted]d 1. Supervision of high risk pregnancy, antepartum Routine second trimester screen - AFP, Serum, Open Spina Bifida - AMBULATORY REFERRAL TO [facility]  Preterm labor symptoms and general obstetric precautions including but not limited to vaginal bleeding, contractions, leaking of fluid and fetal movement were reviewed in detail with the patient. Please refer to After Visit Summary for other counseling recommendations.   Return in about 4 weeks (around 12/03/2021).  Future Appointments  Date Time Provider [place]   11/09/2021  9:30 AM [facility] NURSE [facility]  11/09/2021  9:45 AM [facility]    Emeterio Reeve, MD

## 2021-11-07 LAB — AFP, SERUM, OPEN SPINA BIFIDA
AFP MoM: 1.12
AFP Value: 46 ng/mL
Gest. Age on Collection Date: 18.3 weeks
Maternal Age At EDD: 28.4 yr
OSBR Risk 1 IN: 10000
Test Results:: NEGATIVE
Weight: 222 [lb_av]

## 2021-11-09 ENCOUNTER — Other Ambulatory Visit: Payer: Self-pay | Admitting: *Deleted

## 2021-11-09 ENCOUNTER — Encounter: Payer: Self-pay | Admitting: *Deleted

## 2021-11-09 ENCOUNTER — Ambulatory Visit: Payer: Medicaid Other | Attending: Obstetrics & Gynecology

## 2021-11-09 ENCOUNTER — Other Ambulatory Visit: Payer: Self-pay

## 2021-11-09 ENCOUNTER — Ambulatory Visit: Payer: Medicaid Other | Admitting: *Deleted

## 2021-11-09 VITALS — BP 130/70 | HR 85

## 2021-11-09 DIAGNOSIS — O0990 Supervision of high risk pregnancy, unspecified, unspecified trimester: Secondary | ICD-10-CM | POA: Diagnosis not present

## 2021-11-09 DIAGNOSIS — O99212 Obesity complicating pregnancy, second trimester: Secondary | ICD-10-CM

## 2021-11-09 DIAGNOSIS — O3503X0 Maternal care for (suspected) central nervous system malformation or damage in fetus, choroid plexus cysts, not applicable or unspecified: Secondary | ICD-10-CM

## 2021-11-09 DIAGNOSIS — D259 Leiomyoma of uterus, unspecified: Secondary | ICD-10-CM

## 2021-12-05 ENCOUNTER — Other Ambulatory Visit: Payer: Self-pay

## 2021-12-05 ENCOUNTER — Ambulatory Visit: Payer: Medicaid Other | Attending: Obstetrics and Gynecology

## 2021-12-05 ENCOUNTER — Ambulatory Visit: Payer: Medicaid Other | Admitting: *Deleted

## 2021-12-05 ENCOUNTER — Ambulatory Visit (INDEPENDENT_AMBULATORY_CARE_PROVIDER_SITE_OTHER): Payer: Medicaid Other | Admitting: Obstetrics & Gynecology

## 2021-12-05 ENCOUNTER — Other Ambulatory Visit: Payer: Self-pay | Admitting: *Deleted

## 2021-12-05 VITALS — BP 128/74 | HR 89

## 2021-12-05 VITALS — BP 132/86 | HR 94 | Wt 229.7 lb

## 2021-12-05 DIAGNOSIS — Z3A22 22 weeks gestation of pregnancy: Secondary | ICD-10-CM

## 2021-12-05 DIAGNOSIS — O0990 Supervision of high risk pregnancy, unspecified, unspecified trimester: Secondary | ICD-10-CM

## 2021-12-05 DIAGNOSIS — O3412 Maternal care for benign tumor of corpus uteri, second trimester: Secondary | ICD-10-CM

## 2021-12-05 DIAGNOSIS — O99212 Obesity complicating pregnancy, second trimester: Secondary | ICD-10-CM | POA: Insufficient documentation

## 2021-12-05 DIAGNOSIS — D259 Leiomyoma of uterus, unspecified: Secondary | ICD-10-CM | POA: Diagnosis present

## 2021-12-05 DIAGNOSIS — O3503X0 Maternal care for (suspected) central nervous system malformation or damage in fetus, choroid plexus cysts, not applicable or unspecified: Secondary | ICD-10-CM | POA: Diagnosis present

## 2021-12-05 NOTE — Progress Notes (Signed)
° °  PRENATAL VISIT NOTE  Subjective:  Melanie Lopez is a 30 y.o. G2P1001 at [redacted]w[redacted]d being seen today for ongoing prenatal care.  She is currently monitored for the following issues for this high-risk pregnancy and has Supervision of high risk pregnancy, antepartum on their problem list.  Patient reports no complaints.  Contractions: Not present. Vag. Bleeding: None.  Movement: Present. Denies leaking of fluid.   The following portions of the patient's history were reviewed and updated as appropriate: allergies, current medications, past family history, past medical history, past social history, past surgical history and problem list.   Objective:   Vitals:   12/05/21 1029  BP: 132/86  Pulse: 94  Weight: 229 lb 11.2 oz (104.2 kg)    Fetal Status: Fetal Heart Rate (bpm): 153   Movement: Present     General:  Alert, oriented and cooperative. Patient is in no acute distress.  Skin: Skin is warm and dry. No rash noted.   Cardiovascular: Normal heart rate noted  Respiratory: Normal respiratory effort, no problems with respiration noted  Abdomen: Soft, gravid, appropriate for gestational age.  Pain/Pressure: Present     Pelvic: Cervical exam deferred        Extremities: Normal range of motion.  Edema: Trace  Mental Status: Normal mood and affect. Normal behavior. Normal judgment and thought content.   Assessment and Plan:  Pregnancy: G2P1001 at 106w5d 1. Supervision of high risk pregnancy, antepartum Follow BP  Preterm labor symptoms and general obstetric precautions including but not limited to vaginal bleeding, contractions, leaking of fluid and fetal movement were reviewed in detail with the patient. Please refer to After Visit Summary for other counseling recommendations.   Return in about 4 weeks (around 01/02/2022).  Future Appointments  Date Time Provider [place]  12/05/2021 12:30 PM [facility] NURSE [facility]  12/05/2021 12:45 PM [facility] [place]    Emeterio Reeve, MD

## 2021-12-09 NOTE — L&D Delivery Note (Addendum)
OB/GYN Faculty Practice Delivery Note ? ?Melanie Lopez is a 31 y.o. G2P1001 s/p SVD at [redacted]w[redacted]d She was admitted for IOL  d/t cHTN.  ? ?ROM: 4h 161mith clear fluid ?GBS Status: Negative  ?Maximum Maternal Temperature: 98.7 ? ?Labor Progress: ?Patient progressed well during pregnancy. Started at 2cm dilated and s/p 2 doses of Cytotec before receiving pitocin which was titrated up until patient was fully dilated and 100% effaced. At which point she started pushing until baby was successfully delivered.   ? ?Delivery Date/Time: 03/31/22 at 15:39 ?Delivery: Called to room and patient was complete and pushing. Head delivered . No nuchal cord present. Shoulder and body delivered in usual fashion. Infant with spontaneous cry, placed on mother's abdomen, dried and stimulated. Cord clamped x 2 after 1-minute delay, and cut by FOB under my direct supervision. Cord blood drawn. Placenta delivered spontaneously with gentle cord traction. Fundus firm with massage and Pitocin. Labia, perineum, vagina, and cervix were inspected, and patient was found to have second degree laceration repaired with suture. ? ?Placenta: complete, three vessel cord appreciated ?Complications: None  ?Lacerations: Second degree midline, 2-0 vicryl suture ?EBL: 150 mL ?Analgesia: Epidural ? ? ?Infant: Girl  APGARs 9 and 9  weight pending ? ?JoAlen BleacherD ?[facility]  ? ? ? ?Attestation of CNM Supervision of Resident: Evaluation and management procedures were performed [facility] under my supervision. I was immediately present, gloved, and available for direct supervision, assistance and direction throughout this encounter.  I also confirm that I have verified the information documented in the resident?s note, and that I have also personally reperformed the pertinent components of the physical exam and all of the medical decision making activities.  I have also made any necessary  editorial changes. ? ?DaRenee HarderCNM ?03/31/2022 4:47 PM ? ? ?

## 2021-12-24 ENCOUNTER — Telehealth: Payer: Self-pay

## 2021-12-26 ENCOUNTER — Other Ambulatory Visit: Payer: Self-pay

## 2021-12-26 DIAGNOSIS — O0990 Supervision of high risk pregnancy, unspecified, unspecified trimester: Secondary | ICD-10-CM

## 2021-12-31 ENCOUNTER — Ambulatory Visit: Payer: Medicaid Other | Attending: Maternal & Fetal Medicine

## 2021-12-31 ENCOUNTER — Encounter: Payer: Self-pay | Admitting: Family Medicine

## 2021-12-31 ENCOUNTER — Encounter: Payer: Self-pay | Admitting: *Deleted

## 2021-12-31 ENCOUNTER — Ambulatory Visit (INDEPENDENT_AMBULATORY_CARE_PROVIDER_SITE_OTHER): Payer: Medicaid Other | Admitting: Family Medicine

## 2021-12-31 ENCOUNTER — Other Ambulatory Visit: Payer: Medicaid Other

## 2021-12-31 ENCOUNTER — Other Ambulatory Visit: Payer: Self-pay | Admitting: *Deleted

## 2021-12-31 ENCOUNTER — Ambulatory Visit: Payer: Medicaid Other | Admitting: *Deleted

## 2021-12-31 ENCOUNTER — Other Ambulatory Visit: Payer: Self-pay

## 2021-12-31 VITALS — BP 131/75 | HR 90

## 2021-12-31 VITALS — BP 130/86 | HR 95 | Wt 235.8 lb

## 2021-12-31 DIAGNOSIS — O3412 Maternal care for benign tumor of corpus uteri, second trimester: Secondary | ICD-10-CM | POA: Diagnosis present

## 2021-12-31 DIAGNOSIS — O0990 Supervision of high risk pregnancy, unspecified, unspecified trimester: Secondary | ICD-10-CM

## 2021-12-31 DIAGNOSIS — O99212 Obesity complicating pregnancy, second trimester: Secondary | ICD-10-CM | POA: Diagnosis present

## 2021-12-31 DIAGNOSIS — D259 Leiomyoma of uterus, unspecified: Secondary | ICD-10-CM | POA: Diagnosis present

## 2021-12-31 DIAGNOSIS — O365931 Maternal care for other known or suspected poor fetal growth, third trimester, fetus 1: Secondary | ICD-10-CM

## 2021-12-31 DIAGNOSIS — Z23 Encounter for immunization: Secondary | ICD-10-CM

## 2021-12-31 DIAGNOSIS — Z3A26 26 weeks gestation of pregnancy: Secondary | ICD-10-CM

## 2021-12-31 DIAGNOSIS — Z6838 Body mass index (BMI) 38.0-38.9, adult: Secondary | ICD-10-CM

## 2021-12-31 NOTE — Progress Notes (Signed)
° ° °  Subjective:  Melanie Lopez is a 31 y.o. G2P1001 at [redacted]w[redacted]d being seen today for ongoing prenatal care.  She is currently monitored for the following issues for this high-risk pregnancy and has Supervision of high risk pregnancy, antepartum on their problem list.  Patient reports no complaints.  Contractions: Not present. Vag. Bleeding: None.  Movement: Present. Denies leaking of fluid.   The following portions of the patient's history were reviewed and updated as appropriate: allergies, current medications, past family history, past medical history, past social history, past surgical history and problem list.   Objective:   Vitals:   12/31/21 0832  BP: 130/86  Pulse: 95  Weight: 235 lb 12.8 oz (107 kg)    Fetal Status: Fetal Heart Rate (bpm): 150 Fundal Height: 27 cm Movement: Present     General:  Alert, oriented and cooperative. Patient is in no acute distress.  Skin: Skin is warm and dry. No rash noted.   Cardiovascular: Normal heart rate noted  Respiratory: Normal respiratory effort, no problems with respiration noted  Abdomen: Soft, gravid, appropriate for gestational age. Pain/Pressure: Absent     Pelvic:  Cervical exam deferred        Extremities: Normal range of motion.  Edema: None  Mental Status: Normal mood and affect. Normal behavior. Normal judgment and thought content.     Assessment and Plan:  Pregnancy: G2P1001 at [redacted]w[redacted]d  1. Supervision of high risk pregnancy, antepartum Doing well with normal movement. EFW 16% on 11/2021 [place], follow up growth [place] today.   2. [redacted] weeks gestation of pregnancy GTT and third trimester labs collected today.  - Tdap vaccine greater than or equal to 7yo IM  Preterm labor symptoms and general obstetric precautions including but not limited to vaginal bleeding, contractions, leaking of fluid and fetal movement were reviewed in detail with the patient. Please refer to After Visit Summary for other counseling recommendations.    Return in about 3 weeks (around 01/21/2022) for HROB.   Patriciaann Clan, DO

## 2022-01-01 ENCOUNTER — Other Ambulatory Visit: Payer: Self-pay | Admitting: Family Medicine

## 2022-01-01 DIAGNOSIS — D509 Iron deficiency anemia, unspecified: Secondary | ICD-10-CM

## 2022-01-01 LAB — GLUCOSE TOLERANCE, 2 HOURS W/ 1HR
Glucose, 1 hour: 109 mg/dL (ref 70–179)
Glucose, 2 hour: 94 mg/dL (ref 70–152)
Glucose, Fasting: 78 mg/dL (ref 70–91)

## 2022-01-01 LAB — RPR: RPR Ser Ql: NONREACTIVE

## 2022-01-01 LAB — CBC
Hematocrit: 30.1 % — ABNORMAL LOW (ref 34.0–46.6)
Hemoglobin: 10.4 g/dL — ABNORMAL LOW (ref 11.1–15.9)
MCH: 29.1 pg (ref 26.6–33.0)
MCHC: 34.6 g/dL (ref 31.5–35.7)
MCV: 84 fL (ref 79–97)
Platelets: 333 10*3/uL (ref 150–450)
RBC: 3.58 x10E6/uL — ABNORMAL LOW (ref 3.77–5.28)
RDW: 14.1 % (ref 11.7–15.4)
WBC: 7.9 10*3/uL (ref 3.4–10.8)

## 2022-01-01 LAB — HIV ANTIBODY (ROUTINE TESTING W REFLEX): HIV Screen 4th Generation wRfx: NONREACTIVE

## 2022-01-01 MED ORDER — FERROUS SULFATE 325 (65 FE) MG PO TABS: 325.0000 mg | ORAL_TABLET | Freq: Every day | ORAL | 3 refills | Status: DC

## 2022-01-10 ENCOUNTER — Emergency Department (HOSPITAL_COMMUNITY)
Admission: EM | Admit: 2022-01-10 | Discharge: 2022-01-10 | Disposition: A | Discharge status: Home / Self Care | Payer: Medicaid Other | Attending: Emergency Medicine | Admitting: Emergency Medicine

## 2022-01-10 DIAGNOSIS — O10012 Pre-existing essential hypertension complicating pregnancy, second trimester: Secondary | ICD-10-CM | POA: Diagnosis not present

## 2022-01-10 DIAGNOSIS — Z3A27 27 weeks gestation of pregnancy: Secondary | ICD-10-CM | POA: Diagnosis not present

## 2022-01-10 DIAGNOSIS — O99712 Diseases of the skin and subcutaneous tissue complicating pregnancy, second trimester: Secondary | ICD-10-CM | POA: Diagnosis present

## 2022-01-10 DIAGNOSIS — L0291 Cutaneous abscess, unspecified: Secondary | ICD-10-CM

## 2022-01-10 DIAGNOSIS — L0231 Cutaneous abscess of buttock: Secondary | ICD-10-CM | POA: Diagnosis not present

## 2022-01-10 MED ORDER — CEPHALEXIN 500 MG PO CAPS: 500.0000 mg | ORAL_CAPSULE | Freq: Four times a day (QID) | ORAL | 0 refills | Status: AC

## 2022-01-10 MED ORDER — CEPHALEXIN 250 MG PO CAPS
500.0000 mg | ORAL_CAPSULE | Freq: Once | ORAL | Status: AC
Start: 2022-01-10 — End: 2022-01-10
  Administered 2022-01-10: 500 mg via ORAL
  Filled 2022-01-10: qty 2

## 2022-01-10 NOTE — Discharge Instructions (Addendum)
You came to the emergency department today to be evaluated for your buttocks abscess.  Your exam showed that the abscess has already opened and begun draining.  Please perform warm compresses 3-4 times daily.  Please follow-up with your primary care doctor for wound recheck in 3 to 5 days.  You may have diarrhea from the antibiotics.  It is very important that you continue to take the antibiotics even if you get diarrhea unless a medical professional tells you that you may stop taking them.  If you stop too early the bacteria you are being treated for will become stronger and you may need different, more powerful antibiotics that have more side effects and worsening diarrhea.  Please stay well hydrated and consider probiotics as they may decrease the severity of your diarrhea.    Contact a health care provider if you have: More redness, swelling, or pain around your abscess. More fluid or blood coming from your abscess. Warm skin around your abscess. More pus or a bad smell coming from your abscess. A fever. Muscle aches. Chills or a general ill feeling.

## 2022-01-10 NOTE — ED Provider Notes (Signed)
[facility] EMERGENCY DEPARTMENT Provider Note   CSN: [id number] Arrival date & time: 01/10/22  0434     History  Chief Complaint  Patient presents with   Cyst    Melanie Lopez is a 31 y.o. female with past medical history of anemia, hypertension.  Patient is currently [redacted] weeks pregnant, G2 P1-0-0-1.  Presents the emergency department with a chief complaint of abscess to right buttocks.  Patient reports that she noticed the abscess 5 days ago.  Abscess has increased in size over this time.  Patient states that while sleeping abscess spontaneously ruptured.  Patient noted foul-smelling purulent material leaking from wound.  Patient reports that she has had pain associated with abscess.  Patient has been taking Tylenol to help with her pain.  Last took Tylenol 0100 this morning.    Patient denies any fever, chills, nausea, vomiting, pain with defecation, constipation.  HPI     Home Medications Prior to Admission medications   Medication Sig Start Date End Date Taking? Authorizing Provider  famotidine (PEPCID) 20 MG tablet Take 1 tablet (20 mg total) by mouth 2 (two) times daily. Patient not taking: Reported on 12/31/2021 09/28/21 09/28/22  Rasch, Anderson Malta I, NP  ferrous sulfate 325 (65 FE) MG tablet Take 1 tablet (325 mg total) by mouth daily. 01/01/22 01/01/23  Patriciaann Clan, DO  ondansetron (ZOFRAN ODT) 4 MG disintegrating tablet Take 1 tablet (4 mg total) by mouth every 8 (eight) hours as needed for nausea or vomiting. Patient not taking: Reported on 12/31/2021 09/28/21   Rasch, Anderson Malta I, NP  Prenatal Vit-Fe Fumarate-FA (MULTIVITAMIN-PRENATAL) 27-0.8 MG TABS tablet Take 1 tablet by mouth daily at 12 noon.    [provider]      Allergies    Flagyl [metronidazole]    Review of Systems   Review of Systems  Constitutional:  Negative for chills and fever.  Gastrointestinal:  Negative for blood in stool, constipation, nausea, rectal pain and  vomiting.  Skin:  Positive for wound. Negative for color change, pallor and rash.   Physical Exam Updated Vital Signs BP 126/64 (BP Location: Right Arm)    Pulse 95    Temp 98.4 F (36.9 C) (Oral)    Resp 20    LMP 06/29/2021 (Exact Date)    SpO2 99%  Physical Exam Vitals and nursing note reviewed. Chaperone present: Female RN present as Producer, television/film/video.  Constitutional:      General: She is not in acute distress.    Appearance: She is not ill-appearing, toxic-appearing or diaphoretic.  HENT:     Head: Normocephalic.  Eyes:     General: No scleral icterus.       Right eye: No discharge.        Left eye: No discharge.  Cardiovascular:     Rate and Rhythm: Normal rate.  Pulmonary:     Effort: Pulmonary effort is normal.  Genitourinary:    Rectum: No anal fissure or external hemorrhoid.     Comments: 26mm wound to left buttocks chest lateral from natal cleft, purulent material noted around wound.  No surrounding fluctuance, or induration.  Minimal surrounding erythema.  No external hemorrhoids, no fissures, or perirectal abscesses. Skin:    General: Skin is warm and dry.  Neurological:     General: No focal deficit present.     Mental Status: She is alert.  Psychiatric:        Behavior: Behavior is cooperative.    ED Results / Procedures /  Treatments   Labs (all labs ordered are listed, but only abnormal results are displayed) Labs Reviewed - No data to display  EKG None  Radiology No results found.  Procedures Procedures    Medications Ordered in ED Medications  cephALEXin (KEFLEX) capsule 500 mg (500 mg Oral Given 01/10/22 2751)    ED Course/ Medical Decision Making/ A&P                           Medical Decision Making Risk Prescription drug management.   Alert 31 year old female no acute distress, nontoxic-appearing.  Presents to the emergency department with a chief complaint of buttocks abscess.  Medical history as noted in HPI which complicate  care.  Information was obtained from patient.  Medical record were reviewed including previous provider notes.  Patient has abscess to left buttocks as described above.  Abscess has spontaneously ruptured and is draining adequately, no need for incision and drainage at this time.  Due to surrounding erythema we will start patient on Keflex for concern of cellulitis.  Patient advised to follow-up with PCP in 3 to 5 days for wound recheck.  Patient will perform warm compresses daily.  Discussed symptomatic treatment with Tylenol.  Discussed results, findings, treatment and follow up. Patient advised of return precautions. Patient verbalized understanding and agreed with plan.         Final Clinical Impression(s) / ED Diagnoses Final diagnoses:  Abscess    Rx / DC Orders ED Discharge Orders          Ordered    cephALEXin (KEFLEX) 500 MG capsule  4 times daily        01/10/22 0517              Loni Beckwith, PA-C 01/10/22 0640    Maudie Flakes, MD 01/10/22 [phone]

## 2022-01-10 NOTE — ED Triage Notes (Signed)
Patient has sacral cyst that ruptured in her sleep. Bleeding controlled, but patient in a large amount of pain.  Patient is also [redacted] weeks pregnant  142/68 96 HR 98.2

## 2022-01-21 ENCOUNTER — Telehealth (INDEPENDENT_AMBULATORY_CARE_PROVIDER_SITE_OTHER): Payer: Medicaid Other | Admitting: Family Medicine

## 2022-01-21 ENCOUNTER — Encounter: Payer: Self-pay | Admitting: Family Medicine

## 2022-01-21 DIAGNOSIS — O0993 Supervision of high risk pregnancy, unspecified, third trimester: Secondary | ICD-10-CM

## 2022-01-21 DIAGNOSIS — O0990 Supervision of high risk pregnancy, unspecified, unspecified trimester: Secondary | ICD-10-CM

## 2022-01-21 DIAGNOSIS — O10919 Unspecified pre-existing hypertension complicating pregnancy, unspecified trimester: Secondary | ICD-10-CM | POA: Insufficient documentation

## 2022-01-21 DIAGNOSIS — O10913 Unspecified pre-existing hypertension complicating pregnancy, third trimester: Secondary | ICD-10-CM

## 2022-01-21 NOTE — Progress Notes (Signed)
I connected with Melanie Lopez 01/21/22 at 10:35 AM EST by: MyChart video and verified that I am speaking with the correct person using two identifiers.  Patient is located at [facility] and provider is located at home.     The purpose of this virtual visit is to provide medical care while limiting exposure to the novel coronavirus. I discussed the limitations, risks, security and privacy concerns of performing an evaluation and management service by MyChart video and the availability of in person appointments. I also discussed with the patient that there may be a patient responsible charge related to this service. By engaging in this virtual visit, you consent to the provision of healthcare.  Additionally, you authorize for your insurance to be billed for the services provided during this visit.  The patient expressed understanding and agreed to proceed.  The following staff members participated in the virtual visit:  Evern Core    PRENATAL VISIT NOTE  Subjective:  Melanie Lopez is a 31 y.o. G2P1001 at [redacted]w[redacted]d  for visit for ongoing prenatal care.  She is currently monitored for the following issues for this high-risk pregnancy and has Supervision of high risk pregnancy, antepartum on their problem list.  Patient reports no complaints.  Contractions: Not present. Vag. Bleeding: None.  Movement: Present. Denies leaking of fluid.   The following portions of the patient's history were reviewed and updated as appropriate: allergies, current medications, past family history, past medical history, past social history, past surgical history and problem list.   Objective:  There were no vitals filed for this visit. Self-Obtained  Fetal Status:     Movement: Present     Assessment and Plan:  Pregnancy: G2P1001 at [redacted]w[redacted]d 1. Supervision of high risk pregnancy, antepartum Reviewed visitor policy at hospital  Has [place] with MFM scheduled Discussed in detail reproductive life planning- she is leaning  towards permanent sterilization. Discussed LARC options- including postplacental IUD. Recommended in person visit for BTS consent and process of declaring her intent with Medicaid. Reviewed this does not commit her to having a BTS but does allow medicaid to cover the procedure if she chooses to have it.   2. HTN in pregnancy Unable to assess BP control due to lack of BP cuff.  Pt has upcoming MFM scan and will have BP at that time Has BP cuff at summit she needs to pick up Denies HA, changes in vision, SOB, RUQ pain  Preterm labor symptoms and general obstetric precautions including but not limited to vaginal bleeding, contractions, leaking of fluid and fetal movement were reviewed in detail with the patient.  Return in about 2 weeks (around 02/04/2022) for Routine prenatal care, in person to sign BTS paperwork.  Future Appointments  Date Time Provider [place]  01/29/2022  8:30 AM [facility] NURSE [facility]  01/29/2022  8:45 AM [facility] US6 [facility]     Time spent on virtual visit: 15 minutes  Caren Macadam, MD

## 2022-01-29 ENCOUNTER — Other Ambulatory Visit: Payer: Self-pay

## 2022-01-29 ENCOUNTER — Ambulatory Visit: Payer: Medicaid Other | Attending: Obstetrics and Gynecology

## 2022-01-29 ENCOUNTER — Other Ambulatory Visit: Payer: Self-pay | Admitting: *Deleted

## 2022-01-29 ENCOUNTER — Encounter: Payer: Self-pay | Admitting: *Deleted

## 2022-01-29 ENCOUNTER — Ambulatory Visit: Payer: Medicaid Other | Admitting: *Deleted

## 2022-01-29 VITALS — BP 124/75 | HR 93

## 2022-01-29 DIAGNOSIS — O365931 Maternal care for other known or suspected poor fetal growth, third trimester, fetus 1: Secondary | ICD-10-CM | POA: Insufficient documentation

## 2022-01-29 DIAGNOSIS — Z3A30 30 weeks gestation of pregnancy: Secondary | ICD-10-CM

## 2022-01-29 DIAGNOSIS — D259 Leiomyoma of uterus, unspecified: Secondary | ICD-10-CM

## 2022-01-29 DIAGNOSIS — O99213 Obesity complicating pregnancy, third trimester: Secondary | ICD-10-CM

## 2022-01-29 DIAGNOSIS — O0990 Supervision of high risk pregnancy, unspecified, unspecified trimester: Secondary | ICD-10-CM

## 2022-01-29 DIAGNOSIS — O3412 Maternal care for benign tumor of corpus uteri, second trimester: Secondary | ICD-10-CM | POA: Diagnosis not present

## 2022-01-29 DIAGNOSIS — Z6838 Body mass index (BMI) 38.0-38.9, adult: Secondary | ICD-10-CM | POA: Insufficient documentation

## 2022-01-29 DIAGNOSIS — O10913 Unspecified pre-existing hypertension complicating pregnancy, third trimester: Secondary | ICD-10-CM

## 2022-01-29 DIAGNOSIS — O365930 Maternal care for other known or suspected poor fetal growth, third trimester, not applicable or unspecified: Secondary | ICD-10-CM

## 2022-01-29 DIAGNOSIS — O365990 Maternal care for other known or suspected poor fetal growth, unspecified trimester, not applicable or unspecified: Secondary | ICD-10-CM

## 2022-02-07 ENCOUNTER — Encounter: Payer: Self-pay | Admitting: Obstetrics and Gynecology

## 2022-02-07 ENCOUNTER — Other Ambulatory Visit: Payer: Self-pay

## 2022-02-07 ENCOUNTER — Ambulatory Visit (INDEPENDENT_AMBULATORY_CARE_PROVIDER_SITE_OTHER): Payer: Medicaid Other | Admitting: Obstetrics and Gynecology

## 2022-02-07 VITALS — BP 134/85 | HR 99 | Wt 233.2 lb

## 2022-02-07 DIAGNOSIS — O0990 Supervision of high risk pregnancy, unspecified, unspecified trimester: Secondary | ICD-10-CM

## 2022-02-07 DIAGNOSIS — O10919 Unspecified pre-existing hypertension complicating pregnancy, unspecified trimester: Secondary | ICD-10-CM

## 2022-02-07 NOTE — Patient Instructions (Signed)

## 2022-02-07 NOTE — Progress Notes (Signed)
Subjective:  ?Melanie Lopez is a 31 y.o. G2P1001 at [redacted]w[redacted]d being seen today for ongoing prenatal care.  She is currently monitored for the following issues for this high-risk pregnancy and has Supervision of high risk pregnancy, antepartum and Chronic hypertension affecting pregnancy on their problem list. ? ?Patient reports general discomforts of pregnancy.  Contractions: Not present. Vag. Bleeding: None.  Movement: Present. Denies leaking of fluid.  ? ?The following portions of the patient's history were reviewed and updated as appropriate: allergies, current medications, past family history, past medical history, past social history, past surgical history and problem list. Problem list updated. ? ?Objective:  ? ?Vitals:  ? 02/07/22 1623  ?BP: 134/85  ?Pulse: 99  ?Weight: 233 lb 3.2 oz (105.8 kg)  ? ? ?Fetal Status: Fetal Heart Rate (bpm): 153   Movement: Present    ? ?General:  Alert, oriented and cooperative. Patient is in no acute distress.  ?Skin: Skin is warm and dry. No rash noted.   ?Cardiovascular: Normal heart rate noted  ?Respiratory: Normal respiratory effort, no problems with respiration noted  ?Abdomen: Soft, gravid, appropriate for gestational age. Pain/Pressure: Present     ?Pelvic:  Cervical exam deferred        ?Extremities: Normal range of motion.  Edema: Trace  ?Mental Status: Normal mood and affect. Normal behavior. Normal judgment and thought content.  ? ?Urinalysis:     ? ?Assessment and Plan:  ?Pregnancy: G2P1001 at [redacted]w[redacted]d ? ?1. Supervision of high risk pregnancy, antepartum ?Stable ? ?2. Chronic hypertension affecting pregnancy ?BP stable without meds ?Qd BASA ?Serial growth scans as per MFM protocol ?Growth 18 % on 01/29/22 ? ?Preterm labor symptoms and general obstetric precautions including but not limited to vaginal bleeding, contractions, leaking of fluid and fetal movement were reviewed in detail with the patient. ?Please refer to After Visit Summary for other counseling  recommendations.  ?Return in about 2 weeks (around 02/21/2022) for OB visit, face to face, MD only. ? ? ?Chancy Milroy, MD ?

## 2022-02-13 ENCOUNTER — Ambulatory Visit: Payer: Medicaid Other

## 2022-02-21 ENCOUNTER — Ambulatory Visit (INDEPENDENT_AMBULATORY_CARE_PROVIDER_SITE_OTHER): Payer: Medicaid Other | Admitting: Obstetrics & Gynecology

## 2022-02-21 ENCOUNTER — Encounter: Payer: Self-pay | Admitting: Obstetrics & Gynecology

## 2022-02-21 ENCOUNTER — Other Ambulatory Visit: Payer: Self-pay

## 2022-02-21 VITALS — BP 135/77 | HR 90 | Wt 236.2 lb

## 2022-02-21 DIAGNOSIS — O0990 Supervision of high risk pregnancy, unspecified, unspecified trimester: Secondary | ICD-10-CM

## 2022-02-21 DIAGNOSIS — Z3A33 33 weeks gestation of pregnancy: Secondary | ICD-10-CM

## 2022-02-21 DIAGNOSIS — O10919 Unspecified pre-existing hypertension complicating pregnancy, unspecified trimester: Secondary | ICD-10-CM

## 2022-02-21 NOTE — Progress Notes (Signed)
? ?  PRENATAL VISIT NOTE ? ?Subjective:  ?Melanie Lopez is a 31 y.o. G2P1001 at 70w6dbeing seen today for ongoing prenatal care.  She is currently monitored for the following issues for this high-risk pregnancy and has Supervision of high risk pregnancy, antepartum and Chronic hypertension affecting pregnancy on their problem list. ? ?Patient reports no complaints.  Contractions: Not present. Vag. Bleeding: None.  Movement: Present. Denies leaking of fluid.  ? ?The following portions of the patient's history were reviewed and updated as appropriate: allergies, current medications, past family history, past medical history, past social history, past surgical history and problem list.  ? ?Objective:  ? ?Vitals:  ? 02/21/22 1543  ?BP: 135/77  ?Pulse: 90  ?Weight: 236 lb 3.2 oz (107.1 kg)  ? ? ?Fetal Status: Fetal Heart Rate (bpm): 155   Movement: Present    ? ?General:  Alert, oriented and cooperative. Patient is in no acute distress.  ?Skin: Skin is warm and dry. No rash noted.   ?Cardiovascular: Normal heart rate noted  ?Respiratory: Normal respiratory effort, no problems with respiration noted  ?Abdomen: Soft, gravid, appropriate for gestational age.  Pain/Pressure: Present     ?Pelvic: Cervical exam deferred        ?Extremities: Normal range of motion.  Edema: Trace  ?Mental Status: Normal mood and affect. Normal behavior. Normal judgment and thought content.  ? ?Assessment and Plan:  ?Pregnancy: G2P1001 at 316w6d1. Chronic hypertension affecting pregnancy ?2. [redacted] weeks gestation of pregnancy ?3. Supervision of high risk pregnancy, antepartum ?Stable BP. Already scheduled for growth scan next week. ?IOL 39-40 weeks or earlier if needed.  ?Preterm labor symptoms and general obstetric precautions including but not limited to vaginal bleeding, contractions, leaking of fluid and fetal movement were reviewed in detail with the patient. ?Please refer to After Visit Summary for other counseling recommendations.   ? ?Return in about 2 weeks (around 03/07/2022) for OFFICE OB VISIT (MD only). ? ?Future Appointments  ?Date Time Provider [place]?02/26/2022  8:30 AM [facility] NURSE [facility]  ?02/26/2022  8:45 AM [facility] US4 [facility]  ? ? ?UgVerita SchneidersMD ? ?

## 2022-02-26 ENCOUNTER — Ambulatory Visit: Payer: Medicaid Other | Attending: Obstetrics and Gynecology

## 2022-02-26 ENCOUNTER — Ambulatory Visit: Payer: Medicaid Other | Admitting: *Deleted

## 2022-02-26 ENCOUNTER — Encounter: Payer: Self-pay | Admitting: *Deleted

## 2022-02-26 ENCOUNTER — Other Ambulatory Visit: Payer: Self-pay

## 2022-02-26 VITALS — BP 125/68 | HR 94

## 2022-02-26 DIAGNOSIS — Z3A34 34 weeks gestation of pregnancy: Secondary | ICD-10-CM

## 2022-02-26 DIAGNOSIS — D259 Leiomyoma of uterus, unspecified: Secondary | ICD-10-CM

## 2022-02-26 DIAGNOSIS — O10913 Unspecified pre-existing hypertension complicating pregnancy, third trimester: Secondary | ICD-10-CM

## 2022-02-26 DIAGNOSIS — O365990 Maternal care for other known or suspected poor fetal growth, unspecified trimester, not applicable or unspecified: Secondary | ICD-10-CM

## 2022-02-26 DIAGNOSIS — O99213 Obesity complicating pregnancy, third trimester: Secondary | ICD-10-CM

## 2022-02-26 DIAGNOSIS — O0990 Supervision of high risk pregnancy, unspecified, unspecified trimester: Secondary | ICD-10-CM

## 2022-02-26 DIAGNOSIS — O3413 Maternal care for benign tumor of corpus uteri, third trimester: Secondary | ICD-10-CM

## 2022-03-07 ENCOUNTER — Other Ambulatory Visit (HOSPITAL_COMMUNITY)
Admission: RE | Admit: 2022-03-07 | Discharge: 2022-03-07 | Disposition: A | Discharge status: Home / Self Care | Payer: Medicaid Other | Source: Ambulatory Visit | Attending: Family Medicine | Admitting: Family Medicine

## 2022-03-07 ENCOUNTER — Ambulatory Visit (INDEPENDENT_AMBULATORY_CARE_PROVIDER_SITE_OTHER): Payer: Medicaid Other | Admitting: Obstetrics & Gynecology

## 2022-03-07 VITALS — BP 127/83 | HR 106 | Wt 240.0 lb

## 2022-03-07 DIAGNOSIS — O0990 Supervision of high risk pregnancy, unspecified, unspecified trimester: Secondary | ICD-10-CM | POA: Insufficient documentation

## 2022-03-07 DIAGNOSIS — Z3A35 35 weeks gestation of pregnancy: Secondary | ICD-10-CM

## 2022-03-07 DIAGNOSIS — O10919 Unspecified pre-existing hypertension complicating pregnancy, unspecified trimester: Secondary | ICD-10-CM

## 2022-03-07 NOTE — Progress Notes (Signed)
? ?  PRENATAL VISIT NOTE ? ?Subjective:  ?Melanie Lopez is a 31 y.o. G2P1001 at 20w6dbeing seen today for ongoing prenatal care.  She is currently monitored for the following issues for this high-risk pregnancy and has Supervision of high risk pregnancy, antepartum and Chronic hypertension affecting pregnancy on their problem list. ? ?Patient reports no complaints.  Contractions: Irregular. Vag. Bleeding: None.  Movement: Present. Denies leaking of fluid.  ? ?The following portions of the patient's history were reviewed and updated as appropriate: allergies, current medications, past family history, past medical history, past social history, past surgical history and problem list.  ? ?Objective:  ? ?Vitals:  ? 03/07/22 1558  ?BP: 127/83  ?Pulse: (!) 106  ?Weight: 240 lb (108.9 kg)  ? ? ?Fetal Status: Fetal Heart Rate (bpm): 147   Movement: Present  Presentation: Vertex ? ?General:  Alert, oriented and cooperative. Patient is in no acute distress.  ?Skin: Skin is warm and dry. No rash noted.   ?Cardiovascular: Normal heart rate noted  ?Respiratory: Normal respiratory effort, no problems with respiration noted  ?Abdomen: Soft, gravid, appropriate for gestational age.  Pain/Pressure: Present     ?Pelvic: Cervical exam performed in the presence of a chaperone Dilation: 1.5 Effacement (%): 40 Station: -3  ?Extremities: Normal range of motion.     ?Mental Status: Normal mood and affect. Normal behavior. Normal judgment and thought content.  ? ?Assessment and Plan:  ?Pregnancy: G2P1001 at 34w6d1. Supervision of high risk pregnancy, antepartum ?routine ?- Culture, beta strep (group b only) ?- [facility]Chlamydia [facility] amp [facility] ?2. Chronic hypertension affecting pregnancy ?BP < 140/90 ? ?3. [redacted] weeks gestation of pregnancy ?3550w6d? ?Preterm labor symptoms and general obstetric precautions including but not limited to vaginal bleeding, contractions, leaking of fluid and fetal movement were reviewed in  detail with the patient. ?Please refer to After Visit Summary for other counseling recommendations.  ? ?Return in about 1 week (around 03/14/2022). ? ?No future appointments. ? ?JamEmeterio ReeveD ? ?

## 2022-03-08 LAB — GC/CHLAMYDIA PROBE AMP (~~LOC~~) NOT AT ARMC
Chlamydia: NEGATIVE
Comment: NEGATIVE
Comment: NORMAL
Neisseria Gonorrhea: NEGATIVE

## 2022-03-11 LAB — CULTURE, BETA STREP (GROUP B ONLY): Strep Gp B Culture: NEGATIVE

## 2022-03-20 ENCOUNTER — Ambulatory Visit (INDEPENDENT_AMBULATORY_CARE_PROVIDER_SITE_OTHER): Payer: Medicaid Other | Admitting: Obstetrics and Gynecology

## 2022-03-20 VITALS — BP 128/75 | HR 108 | Wt 237.8 lb

## 2022-03-20 DIAGNOSIS — O10919 Unspecified pre-existing hypertension complicating pregnancy, unspecified trimester: Secondary | ICD-10-CM

## 2022-03-20 DIAGNOSIS — O0990 Supervision of high risk pregnancy, unspecified, unspecified trimester: Secondary | ICD-10-CM

## 2022-03-20 DIAGNOSIS — Z3A37 37 weeks gestation of pregnancy: Secondary | ICD-10-CM

## 2022-03-20 NOTE — Progress Notes (Signed)
? ?  PRENATAL VISIT NOTE ? ?Subjective:  ?Melanie Lopez is a 31 y.o. G2P1001 at 39w5dbeing seen today for ongoing prenatal care.  She is currently monitored for the following issues for this high-risk pregnancy and has Supervision of high risk pregnancy, antepartum and Chronic hypertension affecting pregnancy on their problem list. ? ?Patient doing well with no acute concerns today. She reports no complaints.  Contractions: Irritability. Vag. Bleeding: None.  Movement: Present. Denies leaking of fluid.  ? ?The following portions of the patient's history were reviewed and updated as appropriate: allergies, current medications, past family history, past medical history, past social history, past surgical history and problem list. Problem list updated. ? ?Objective:  ? ?Vitals:  ? 03/20/22 1550  ?BP: 128/75  ?Pulse: (!) 108  ?Weight: 237 lb 12.8 oz (107.9 kg)  ? ? ?Fetal Status: Fetal Heart Rate (bpm): 138 Fundal Height: 36 cm Movement: Present    ? ?General:  Alert, oriented and cooperative. Patient is in no acute distress.  ?Skin: Skin is warm and dry. No rash noted.   ?Cardiovascular: Normal heart rate noted  ?Respiratory: Normal respiratory effort, no problems with respiration noted  ?Abdomen: Soft, gravid, appropriate for gestational age.  Pain/Pressure: Present     ?Pelvic: Cervical exam deferred        ?Extremities: Normal range of motion.  Edema: Trace  ?Mental Status:  Normal mood and affect. Normal behavior. Normal judgment and thought content.  ? ?Assessment and Plan:  ?Pregnancy: G2P1001 at 353w5d ?1. Supervision of high risk pregnancy, antepartum ?Continue routine care IOL will be scheduled [facility] 39[facility]39.6 [facility] ?2. [redacted] weeks gestation of pregnancy ? ? ?3. Chronic hypertension affecting pregnancy ?Normal BP today, pt on no meds, see above for the plan ? ?Term labor symptoms and general obstetric precautions including but not limited to vaginal bleeding, contractions, leaking of fluid and fetal  movement were reviewed in detail with the patient. ? ?Please refer to After Visit Summary for other counseling recommendations.  ? ?Return in about 1 week (around 03/27/2022) [facility]. ? ? ?LaLynnda ShieldsMD ?Faculty Attending ?[facility]  ?

## 2022-03-25 ENCOUNTER — Telehealth (HOSPITAL_COMMUNITY): Payer: Self-pay | Admitting: *Deleted

## 2022-03-25 ENCOUNTER — Encounter (HOSPITAL_COMMUNITY): Payer: Self-pay

## 2022-03-25 ENCOUNTER — Encounter: Payer: Self-pay | Admitting: *Deleted

## 2022-03-25 ENCOUNTER — Other Ambulatory Visit: Payer: Self-pay | Admitting: Family Medicine

## 2022-03-25 NOTE — Telephone Encounter (Signed)
Preadmission screen  

## 2022-03-26 ENCOUNTER — Encounter (HOSPITAL_COMMUNITY): Payer: Self-pay | Admitting: *Deleted

## 2022-03-26 ENCOUNTER — Telehealth (HOSPITAL_COMMUNITY): Payer: Self-pay | Admitting: *Deleted

## 2022-03-26 NOTE — Telephone Encounter (Signed)
Preadmission screen  

## 2022-03-27 ENCOUNTER — Telehealth (INDEPENDENT_AMBULATORY_CARE_PROVIDER_SITE_OTHER): Payer: Medicaid Other | Admitting: Obstetrics & Gynecology

## 2022-03-27 DIAGNOSIS — O0990 Supervision of high risk pregnancy, unspecified, unspecified trimester: Secondary | ICD-10-CM

## 2022-03-27 DIAGNOSIS — O10913 Unspecified pre-existing hypertension complicating pregnancy, third trimester: Secondary | ICD-10-CM

## 2022-03-27 DIAGNOSIS — O0993 Supervision of high risk pregnancy, unspecified, third trimester: Secondary | ICD-10-CM

## 2022-03-27 DIAGNOSIS — O10919 Unspecified pre-existing hypertension complicating pregnancy, unspecified trimester: Secondary | ICD-10-CM

## 2022-03-27 NOTE — Progress Notes (Signed)
? ? ?  TELEHEALTH OBSTETRICS VISIT ENCOUNTER NOTE ? ?Provider location: Center for [employer] [facility] [employer] [facility]  ? ?Patient location: Home ? ?I connected with Melanie Lopez on 03/27/22 at  1:15 PM EDT by telephone at home and verified that I am speaking with the correct person using two identifiers. Of note, unable to do video encounter due to technical difficulties.  ?  ?I discussed the limitations, risks, security and privacy concerns of performing an evaluation and management service by telephone and the availability of in person appointments. I also discussed with the patient that there may be a patient responsible charge related to this service. The patient expressed understanding and agreed to proceed. ? ?Subjective:  ?Melanie Lopez is a 31 y.o. G2P1001 at 29w5dbeing followed for ongoing prenatal care.  She is currently monitored for the following issues for this high-risk pregnancy and has Supervision of high risk pregnancy, antepartum and Chronic hypertension affecting pregnancy on their problem list. ? ?Patient reports no complaints. Reports fetal movement. Denies any contractions, bleeding or leaking of fluid.  ? ?The following portions of the patient's history were reviewed and updated as appropriate: allergies, current medications, past family history, past medical history, past social history, past surgical history and problem list.  ? ?Objective:  ?Last menstrual period 06/29/2021. ?General:  Alert, oriented and cooperative.   ?Mental Status: Normal mood and affect perceived. Normal judgment and thought content.  ?Rest of physical exam deferred due to type of encounter ? ?Assessment and Plan:  ?Pregnancy: G2P1001 at 329w5d1. Supervision of high risk pregnancy, antepartum ?IOL 39.1 weeks is scheduled ? ?2. Chronic hypertension affecting pregnancy ?Has been wnl but has no BP cuff to test at home ? ?Term labor symptoms and general obstetric precautions including but not limited to  vaginal bleeding, contractions, leaking of fluid and fetal movement were reviewed in detail with the patient.  ?I discussed the assessment and treatment plan with the patient. The patient was provided an opportunity to ask questions and all were answered. The patient agreed with the plan and demonstrated an understanding of the instructions. The patient was advised to call back or seek an in-person office evaluation/[facility] concerning [facility]. ?Please refer to After Visit Summary for other counseling recommendations.  ? ?I provided 10 minutes of non-face-to-face time during this encounter. ? ?Return if symptoms worsen or fail to improve, for post partum. ? ?Future Appointments  ?Date Time Provider [place]?03/30/2022  6:45 AM [facility] SCHED ROOM [facility] None  ? ? ?JaEmeterio ReeveMD ?[facility] ? ? ?

## 2022-03-27 NOTE — Progress Notes (Signed)
I connected with  Jaci Carrel on 03/27/22 at  1:15 PM EDT by telephone and verified that I am speaking with the correct person using two identifiers. ?  ?I discussed the limitations, risks, security and privacy concerns of performing an evaluation and management service by telephone and the availability of in person appointments. I also discussed with the patient that there may be a patient responsible charge related to this service. The patient expressed understanding and agreed to proceed. ? ?Denies s/s of elevated BP. No BP cuff at home. ? ?Annabell Howells, RN ?03/27/2022  1:17 PM ? ?

## 2022-03-30 ENCOUNTER — Inpatient Hospital Stay (HOSPITAL_COMMUNITY)
Admission: AD | Admit: 2022-03-30 | Discharge: 2022-04-01 | DRG: 807 | Disposition: A | Discharge status: Home / Self Care | Payer: Medicaid Other | Attending: Obstetrics & Gynecology | Admitting: Obstetrics & Gynecology

## 2022-03-30 ENCOUNTER — Encounter (HOSPITAL_COMMUNITY): Payer: Self-pay | Admitting: Obstetrics and Gynecology

## 2022-03-30 ENCOUNTER — Other Ambulatory Visit: Payer: Self-pay

## 2022-03-30 ENCOUNTER — Inpatient Hospital Stay (HOSPITAL_COMMUNITY): Payer: Medicaid Other

## 2022-03-30 DIAGNOSIS — Z3A39 39 weeks gestation of pregnancy: Secondary | ICD-10-CM

## 2022-03-30 DIAGNOSIS — O0990 Supervision of high risk pregnancy, unspecified, unspecified trimester: Secondary | ICD-10-CM

## 2022-03-30 DIAGNOSIS — O1092 Unspecified pre-existing hypertension complicating childbirth: Secondary | ICD-10-CM | POA: Diagnosis not present

## 2022-03-30 DIAGNOSIS — O1002 Pre-existing essential hypertension complicating childbirth: Secondary | ICD-10-CM | POA: Diagnosis present

## 2022-03-30 DIAGNOSIS — O10913 Unspecified pre-existing hypertension complicating pregnancy, third trimester: Secondary | ICD-10-CM | POA: Diagnosis present

## 2022-03-30 LAB — CBC
HCT: 30.7 % — ABNORMAL LOW (ref 36.0–46.0)
Hemoglobin: 10 g/dL — ABNORMAL LOW (ref 12.0–15.0)
MCH: 28.2 pg (ref 26.0–34.0)
MCHC: 32.6 g/dL (ref 30.0–36.0)
MCV: 86.7 fL (ref 80.0–100.0)
Platelets: 305 10*3/uL (ref 150–400)
RBC: 3.54 MIL/uL — ABNORMAL LOW (ref 3.87–5.11)
RDW: 14.9 % (ref 11.5–15.5)
WBC: 7.5 10*3/uL (ref 4.0–10.5)
nRBC: 0 % (ref 0.0–0.2)

## 2022-03-30 LAB — COMPREHENSIVE METABOLIC PANEL
ALT: 15 U/L (ref 0–44)
AST: 19 U/L (ref 15–41)
Albumin: 2.7 g/dL — ABNORMAL LOW (ref 3.5–5.0)
Alkaline Phosphatase: 97 U/L (ref 38–126)
Anion gap: 8 (ref 5–15)
BUN: <5 mg/dL — ABNORMAL LOW (ref 6–20)
CO2: 20 mmol/L — ABNORMAL LOW (ref 22–32)
Calcium: 8.5 mg/dL — ABNORMAL LOW (ref 8.9–10.3)
Chloride: 107 mmol/L (ref 98–111)
Creatinine, Ser: 0.58 mg/dL (ref 0.44–1.00)
GFR, Estimated: >60 mL/min (ref >60)
Glucose, Bld: 71 mg/dL (ref 70–99)
Potassium: 3.3 mmol/L — ABNORMAL LOW (ref 3.5–5.1)
Sodium: 135 mmol/L (ref 135–145)
Total Bilirubin: 0.5 mg/dL (ref 0.3–1.2)
Total Protein: 6.5 g/dL (ref 6.5–8.1)

## 2022-03-30 LAB — PROTEIN / CREATININE RATIO, URINE
Creatinine, Urine: 260.41 mg/dL
Protein Creatinine Ratio: 0.17 mg/mg{Cre} — ABNORMAL HIGH (ref 0.00–0.15)
Total Protein, Urine: 43 mg/dL

## 2022-03-30 LAB — TYPE AND SCREEN
ABO/RH(D): O POS
Antibody Screen: NEGATIVE

## 2022-03-30 MED ORDER — FENTANYL CITRATE (PF) 100 MCG/2ML IJ SOLN
50.0000 ug | INTRAMUSCULAR | Status: DC | PRN
  Administered 2022-03-31: 100 ug via INTRAVENOUS
  Filled 2022-03-30: qty 2

## 2022-03-30 MED ORDER — LIDOCAINE HCL (PF) 1 % IJ SOLN: 30.0000 mL | INTRAMUSCULAR | Status: DC | PRN

## 2022-03-30 MED ORDER — LACTATED RINGERS IV SOLN: 500.0000 mL | INTRAVENOUS | Status: DC | PRN

## 2022-03-30 MED ORDER — SOD CITRATE-CITRIC ACID 500-334 MG/5ML PO SOLN: 30.0000 mL | ORAL | Status: DC | PRN

## 2022-03-30 MED ORDER — LACTATED RINGERS IV SOLN: INTRAVENOUS | Status: DC

## 2022-03-30 MED ORDER — TERBUTALINE SULFATE 1 MG/ML IJ SOLN: 0.2500 mg | Freq: Once | INTRAMUSCULAR | Status: DC | PRN

## 2022-03-30 MED ORDER — MISOPROSTOL 25 MCG QUARTER TABLET: 25.0000 ug | ORAL_TABLET | ORAL | Status: DC | PRN

## 2022-03-30 MED ORDER — ACETAMINOPHEN 325 MG PO TABS: 650.0000 mg | ORAL_TABLET | ORAL | Status: DC | PRN

## 2022-03-30 MED ORDER — ONDANSETRON HCL 4 MG/2ML IJ SOLN
4.0000 mg | Freq: Four times a day (QID) | INTRAMUSCULAR | Status: DC | PRN
  Administered 2022-03-31: 4 mg via INTRAVENOUS
  Filled 2022-03-30: qty 2

## 2022-03-30 MED ORDER — OXYTOCIN-SODIUM CHLORIDE 30-0.9 UT/500ML-% IV SOLN
1.0000 m[IU]/min | INTRAVENOUS | Status: DC
  Administered 2022-03-31: 2 m[IU]/min via INTRAVENOUS
  Filled 2022-03-30: qty 500

## 2022-03-30 MED ORDER — MISOPROSTOL 50MCG HALF TABLET
50.0000 ug | ORAL_TABLET | ORAL | Status: DC | PRN
  Administered 2022-03-30 – 2022-03-31 (×2): 50 ug via ORAL
  Filled 2022-03-30 (×2): qty 1

## 2022-03-30 MED ORDER — OXYTOCIN-SODIUM CHLORIDE 30-0.9 UT/500ML-% IV SOLN
2.5000 [IU]/h | INTRAVENOUS | Status: DC
  Administered 2022-03-31: 2.5 [IU]/h via INTRAVENOUS

## 2022-03-30 MED ORDER — OXYCODONE-ACETAMINOPHEN 5-325 MG PO TABS: 1.0000 | ORAL_TABLET | ORAL | Status: DC | PRN

## 2022-03-30 MED ORDER — OXYTOCIN BOLUS FROM INFUSION
333.0000 mL | Freq: Once | INTRAVENOUS | Status: AC
  Administered 2022-03-31: 333 mL via INTRAVENOUS

## 2022-03-30 NOTE — H&P (Signed)
OBSTETRIC ADMISSION HISTORY AND PHYSICAL ? ?Cyril Railey is a 31 y.o. female G2P1001 with IUP at 69w1dby LMP presenting for IOL for cHTN. She reports +FMs, No LOF, no VB, no blurry vision, headaches or peripheral edema, and RUQ pain.  She plans on breast and bottle feeding. She declines birth control. ?She received her prenatal care at  [facility]  ? ?Dating: By LMP --->  Estimated Date of Delivery: 04/05/22 ? ?Sono:   ?'@[redacted]w[redacted]d'$ , CWD, normal anatomy, cephalic presentation, posterior placental lie, 2413g, 39% EFW ? ? ?Prenatal History/Complications:  ?cHTN not on medications ? ? ?Past Medical History: ?Past Medical History:  ?Diagnosis Date  ? Anemia   ? Hypertension   ? Post-dates pregnancy 08/07/2016  ? ? ?Past Surgical History: ?Past Surgical History:  ?Procedure Laterality Date  ? NO PAST SURGERIES    ? ? ?Obstetrical History: ?OB History   ? ? Gravida  ?2  ? Para  ?1  ? Term  ?1  ? Preterm  ?   ? AB  ?   ? Living  ?1  ?  ? ? SAB  ?   ? IAB  ?   ? Ectopic  ?   ? Multiple  ?0  ? Live Births  ?1  ?   ?  ?  ? ? ?Social History ?Social History  ? ?Socioeconomic History  ? Marital status: Single  ?  Spouse name: Not on file  ? Number of children: Not on file  ? Years of education: Not on file  ? Highest education level: Not on file  ?Occupational History  ? Not on file  ?Tobacco Use  ? Smoking status: Never  ? Smokeless tobacco: Never  ?Vaping Use  ? Vaping Use: Never used  ?Substance and Sexual Activity  ? Alcohol use: Not Currently  ?  Comment: occ  ? Drug use: No  ? Sexual activity: Not Currently  ?  Birth control/protection: None  ?Other Topics Concern  ? Not on file  ?Social History Narrative  ? Not on file  ? ?Social Determinants of Health  ? ?Financial Resource Strain: Not on file  ?Food Insecurity: Food Insecurity Present  ? Worried About RCharity fundraiserin the Last Year: Sometimes true  ? Ran Out of Food in the Last Year: Sometimes true  ?Transportation Needs: No Transportation Needs  ? Lack of Transportation  (Medical): No  ? Lack of Transportation (Non-Medical): No  ?Physical Activity: Not on file  ?Stress: Not on file  ?Social Connections: Not on file  ? ? ?Family History: ?Family History  ?Problem Relation Age of Onset  ? Anemia Mother   ? Cancer Father   ? Hypertension Father   ? Hypertension Sister   ? Anemia Sister   ? Anemia Brother   ? Anemia Maternal Aunt   ? Anemia Maternal Grandmother   ? Diabetes Paternal Grandmother   ? Hypertension Paternal Grandmother   ? ? ?Allergies: ?Allergies  ?Allergen Reactions  ? Flagyl [Metronidazole] Hives and Rash  ? Sulfamethoxazole-Trimethoprim   ? ? ?Medications Prior to Admission  ?Medication Sig Dispense Refill Last Dose  ? famotidine (PEPCID) 20 MG tablet Take 1 tablet (20 mg total) by mouth 2 (two) times daily. 60 tablet 1   ? ferrous sulfate 325 (65 FE) MG tablet Take 1 tablet (325 mg total) by mouth daily. 30 tablet 3   ? Prenatal Vit-Fe Fumarate-FA (MULTIVITAMIN-PRENATAL) 27-0.8 MG TABS tablet Take 1 tablet by mouth daily at 12  noon.     ? ? ? ?Review of Systems  ? ?All systems reviewed and negative except as stated in HPI ? ?Last menstrual period 06/29/2021. ?General appearance: alert, appears comfortable ?Lungs: clear to auscultation bilaterally ?Heart: regular rate and rhythm ?Abdomen: soft, non-tender; bowel sounds normal ?Extremities: Homans sign is negative, no sign of DVT ?Presentation: cephalic ?Fetal monitoring Baseline: 140 bpm, Variability: Good {> 6 bpm), Accelerations: Non-reactive but appropriate for gestational age, and Decelerations: Absent ?Uterine activity irritability ?Dilation: 2 ?Effacement (%): 50 ?Station: -2, -3 ?Exam by:: Dr. Cy Blamer ? ? ?Prenatal labs: ?ABO, Rh: --/--/PENDING (04/22 2056) ?Antibody: PENDING (04/22 2056) ?Rubella: Immune (10/03 0000) ?RPR: Non Reactive (01/23 0826)  ?HBsAg: Negative (10/03 0000)  ?HIV: Non Reactive (01/23 0826)  ?GBS: Negative/-- (03/30 1615)  ?2 hr Glucola normal ?Genetic screening LR NIPS, negative horizon 14  carrier screening ?Anatomy US normal ? ?Prenatal Transfer Tool  ?Maternal Diabetes: No ?Genetic Screening: Normal ?Maternal Ultrasounds/Referrals: Normal ?Fetal Ultrasounds or other Referrals:  None ?Maternal Substance Abuse:  No ?Significant Maternal Medications:  None ?Significant Maternal Lab Results: Group B Strep negative ? ?Results for orders placed or performed during the hospital encounter of 03/30/22 (from the past 24 hour(s))  ?Type and screen  ? Collection Time: 03/30/22  8:56 PM  ?Result Value Ref Range  ? ABO/RH(D) PENDING   ? Antibody Screen PENDING   ? Sample Expiration    ?  04/02/2022,2359 ?Performed at [facility], [place] [address] [place], [place] [address] ?  ? ? ?Patient Active Problem List  ? Diagnosis Date Noted  ? Chronic hypertension in obstetric context in third trimester 03/30/2022  ? Chronic hypertension affecting pregnancy 01/21/2022  ? Supervision of high risk pregnancy, antepartum 10/04/2021  ? ? ?Assessment/Plan:  ?Shatyra Becka is a 31 y.o. G2P1001 at 7w1dhere for IOL at term for cHTN ? ?#Labor:Was 1.5 cm in office at 35wk6d. Today cervix 2/50/-3. Soft and mid-position. Patient would like to avoid foley balloon as she watched her family member get it placed and is concerned about the pain. We discussed that foley balloon with cytotec can help facilitate a more speedy ripening phase and if her cervix doesn't ripen with a few doses of cytotec it might be something to reconsider. Patient expressed understanding. Will start with Buccal cytotec 50 mcg and recheck in 4 hours. Patient open to AROM and pitocin when appropriate/if necessary. ?#Pain: Plans epidural  ?#FWB: Has not had accels, otherwise reassuring.  ?#ID:  GBS neg ?#MOF: Breast and bottle  ?#MOC: declines ?#Circ:  N/A ? ?#cHTN ?Not on meds. BP here 128-130s/70s-80s . Admission preE labs sent. Currently asymptomatic. Will continue to monitor ? ?ARenard Matter MD, MPH ?OB Fellow, Faculty Practice ? ? ? ? ? ?

## 2022-03-31 ENCOUNTER — Inpatient Hospital Stay (HOSPITAL_COMMUNITY): Payer: Medicaid Other | Admitting: Anesthesiology

## 2022-03-31 ENCOUNTER — Encounter (HOSPITAL_COMMUNITY): Payer: Self-pay | Admitting: Obstetrics and Gynecology

## 2022-03-31 DIAGNOSIS — O1092 Unspecified pre-existing hypertension complicating childbirth: Secondary | ICD-10-CM | POA: Diagnosis not present

## 2022-03-31 DIAGNOSIS — Z3A39 39 weeks gestation of pregnancy: Secondary | ICD-10-CM | POA: Diagnosis not present

## 2022-03-31 LAB — RPR: RPR Ser Ql: NONREACTIVE

## 2022-03-31 LAB — CBC
HCT: 30.3 % — ABNORMAL LOW (ref 36.0–46.0)
Hemoglobin: 9.7 g/dL — ABNORMAL LOW (ref 12.0–15.0)
MCH: 28 pg (ref 26.0–34.0)
MCHC: 32 g/dL (ref 30.0–36.0)
MCV: 87.3 fL (ref 80.0–100.0)
Platelets: 278 10*3/uL (ref 150–400)
RBC: 3.47 MIL/uL — ABNORMAL LOW (ref 3.87–5.11)
RDW: 15 % (ref 11.5–15.5)
WBC: 9.1 10*3/uL (ref 4.0–10.5)
nRBC: 0 % (ref 0.0–0.2)

## 2022-03-31 MED ORDER — LACTATED RINGERS IV SOLN
500.0000 mL | Freq: Once | INTRAVENOUS | Status: AC
  Administered 2022-03-31: 500 mL via INTRAVENOUS

## 2022-03-31 MED ORDER — FENTANYL-BUPIVACAINE-NACL 0.5-0.125-0.9 MG/250ML-% EP SOLN
12.0000 mL/h | EPIDURAL | Status: DC | PRN
  Administered 2022-03-31: 12 mL/h via EPIDURAL
  Filled 2022-03-31: qty 250

## 2022-03-31 MED ORDER — WITCH HAZEL-GLYCERIN EX PADS: 1.0000 "application " | MEDICATED_PAD | CUTANEOUS | Status: DC | PRN

## 2022-03-31 MED ORDER — EPHEDRINE 5 MG/ML INJ: 10.0000 mg | INTRAVENOUS | Status: DC | PRN

## 2022-03-31 MED ORDER — DIBUCAINE (PERIANAL) 1 % EX OINT: 1.0000 "application " | TOPICAL_OINTMENT | CUTANEOUS | Status: DC | PRN

## 2022-03-31 MED ORDER — ONDANSETRON HCL 4 MG/2ML IJ SOLN: 4.0000 mg | INTRAMUSCULAR | Status: DC | PRN

## 2022-03-31 MED ORDER — ACETAMINOPHEN 325 MG PO TABS
650.0000 mg | ORAL_TABLET | ORAL | Status: DC | PRN
  Administered 2022-04-01: 650 mg via ORAL
  Filled 2022-03-31: qty 2

## 2022-03-31 MED ORDER — BENZOCAINE-MENTHOL 20-0.5 % EX AERO
1.0000 "application " | INHALATION_SPRAY | CUTANEOUS | Status: DC | PRN
  Administered 2022-03-31: 1 via TOPICAL
  Filled 2022-03-31: qty 56

## 2022-03-31 MED ORDER — LIDOCAINE HCL (PF) 1 % IJ SOLN
INTRAMUSCULAR | Status: DC | PRN
  Administered 2022-03-31 (×2): 4 mL via EPIDURAL

## 2022-03-31 MED ORDER — PRENATAL MULTIVITAMIN CH
1.0000 | ORAL_TABLET | Freq: Every day | ORAL | Status: DC
  Administered 2022-04-01: 1 via ORAL
  Filled 2022-03-31: qty 1

## 2022-03-31 MED ORDER — PHENYLEPHRINE 80 MCG/ML (10ML) SYRINGE FOR IV PUSH (FOR BLOOD PRESSURE SUPPORT): 80.0000 ug | PREFILLED_SYRINGE | INTRAVENOUS | Status: DC | PRN

## 2022-03-31 MED ORDER — TETANUS-DIPHTH-ACELL PERTUSSIS 5-2.5-18.5 LF-MCG/0.5 IM SUSY: 0.5000 mL | PREFILLED_SYRINGE | Freq: Once | INTRAMUSCULAR | Status: DC

## 2022-03-31 MED ORDER — COCONUT OIL OIL: 1.0000 "application " | TOPICAL_OIL | Status: DC | PRN

## 2022-03-31 MED ORDER — DIPHENHYDRAMINE HCL 50 MG/ML IJ SOLN: 12.5000 mg | INTRAMUSCULAR | Status: DC | PRN

## 2022-03-31 MED ORDER — ONDANSETRON HCL 4 MG PO TABS: 4.0000 mg | ORAL_TABLET | ORAL | Status: DC | PRN

## 2022-03-31 MED ORDER — SIMETHICONE 80 MG PO CHEW: 80.0000 mg | CHEWABLE_TABLET | ORAL | Status: DC | PRN

## 2022-03-31 MED ORDER — ZOLPIDEM TARTRATE 5 MG PO TABS: 5.0000 mg | ORAL_TABLET | Freq: Every evening | ORAL | Status: DC | PRN

## 2022-03-31 MED ORDER — IBUPROFEN 600 MG PO TABS
600.0000 mg | ORAL_TABLET | Freq: Four times a day (QID) | ORAL | Status: DC
  Administered 2022-03-31 – 2022-04-01 (×3): 600 mg via ORAL
  Filled 2022-03-31 (×4): qty 1

## 2022-03-31 MED ORDER — SENNOSIDES-DOCUSATE SODIUM 8.6-50 MG PO TABS
2.0000 | ORAL_TABLET | Freq: Every day | ORAL | Status: DC
  Filled 2022-03-31: qty 2

## 2022-03-31 MED ORDER — DIPHENHYDRAMINE HCL 25 MG PO CAPS
25.0000 mg | ORAL_CAPSULE | Freq: Four times a day (QID) | ORAL | Status: DC | PRN
Start: 2022-03-31 — End: 2022-04-01

## 2022-03-31 NOTE — Progress Notes (Signed)
Melanie Lopez is a 31 y.o. G2P1001 at 107w2dadmitted for induction of labor due to [place] ? ?Subjective: ?Patient reports she is doing well. Some minor cramping ? ?Objective: ?BP 131/80   Pulse 89   Temp 98.5 ?F (36.9 ?C) (Oral)   Resp 16   Ht '5\' 6"'$  (1.676 m)   Wt 108.9 kg   LMP 06/29/2021 (Exact Date)   BMI 38.74 kg/m?  ?No intake/output data recorded. ?No intake/output data recorded. ? ?FHT:  FHR: 130 bpm, variability: moderate,  accelerations:  Present,  decelerations:  Absent ?UC:   irregular ?SVE:   Dilation: 2.5 ?Effacement (%): 50 ?Station: -2, -1 ?Exam by:: DGenevieve Norlander RN ? ?Labs: ?Lab Results  ?Component Value Date  ? WBC 7.5 03/30/2022  ? HGB 10.0 (L) 03/30/2022  ? HCT 30.7 (L) 03/30/2022  ? MCV 86.7 03/30/2022  ? PLT 305 03/30/2022  ? ? ?Assessment / Plan: ?G2P1001 at 350w2ddmitted for [facility]? ?Labor:  cervix now ~2.5 (different examiners as I was in a delivery . Cytotec #2 given. At admission patient very much did not want foley balloon as an induction method. Will further discuss potential foley balloon at next check if still needing further ripening.  ? ?Fetal Wellbeing:  Category I ?Pain Control: Plans Epidural ?I/D:   GBS neg ? ? ?#cHTN ?BP here mostly normotensive, few mild range. Labs normal at admission. Continue to monitor ? ?AnRenard Matter4/23/2023, 5:06 AM ? ? ?

## 2022-03-31 NOTE — Anesthesia Preprocedure Evaluation (Signed)
Anesthesia Evaluation  ?Patient identified by MRN, date of birth, ID band ?Patient awake ? ? ? ?Reviewed: ?Allergy & Precautions, Patient's Chart, lab work & pertinent test results ? ?History of Anesthesia Complications ?Negative for: history of anesthetic complications ? ?Airway ?Mallampati: II ? ?TM Distance: >3 FB ?Neck ROM: Full ? ? ? Dental ?no notable dental hx. ? ?  ?Pulmonary ?neg pulmonary ROS,  ?  ?Pulmonary exam normal ? ? ? ? ? ? ? Cardiovascular ?hypertension, Normal cardiovascular exam ? ? ?  ?Neuro/Psych ?negative neurological ROS ? negative psych ROS  ? GI/Hepatic ?negative GI ROS, Neg liver ROS,   ?Endo/Other  ?negative endocrine ROS ? Renal/GU ?negative Renal ROS  ?negative genitourinary ?  ?Musculoskeletal ?negative musculoskeletal ROS ?(+)  ? Abdominal ?  ?Peds ? Hematology ? ?(+) Blood dyscrasia (Hgb 9.7), anemia ,   ?Anesthesia Other Findings ?Day of surgery medications reviewed with patient. ? Reproductive/Obstetrics ?(+) Pregnancy ? ?  ? ? ? ? ? ? ? ? ? ? ? ? ? ?  ?  ? ? ? ? ? ? ? ? ?Anesthesia Physical ?Anesthesia Plan ? ?ASA: 3 ? ?Anesthesia Plan: Epidural  ? ?Post-op Pain Management:   ? ?Induction:  ? ?PONV Risk Score and Plan: Treatment may vary due to age or medical condition ? ?Airway Management Planned: Natural Airway ? ?Additional Equipment: Fetal Monitoring ? ?Intra-op Plan:  ? ?Post-operative Plan:  ? ?Informed Consent: I have reviewed the patients History and Physical, chart, labs and discussed the procedure including the risks, benefits and alternatives for the proposed anesthesia with the patient or authorized representative who has indicated his/her understanding and acceptance.  ? ? ? ? ? ?Plan Discussed with:  ? ?Anesthesia Plan Comments:   ? ? ? ? ? ? ?Anesthesia Quick Evaluation ? ?

## 2022-03-31 NOTE — Progress Notes (Addendum)
Labor Progress Note ?Melanie Lopez is a 31 y.o. G2P1001 at 34w2dpresented for IOL d/t cHTN ?S: Patient is resting comfortably. Endorses increased contractions with pitocin.  ? ?O:  ?BP 132/83   Pulse 74   Temp 98.5 ?F (36.9 ?C) (Oral)   Resp 16   Ht '5\' 6"'$  (1.676 m)   Wt 108.9 kg   LMP 06/29/2021 (Exact Date)   BMI 38.74 kg/m?  ?EFM: 140 bpm / moderate variability/ +acceleration, no decels ? ?CVE: Dilation: 6.5 ?Effacement (%): 80 ?Station: -2 ?Presentation: Vertex ?Exam by:: SRussell, RN ? ? ?A&P: 31y.o. G2P1001 349w2ddmitted for IOL d/t cHTN ?#Labor: Progressing well. Cervix is dilated to 6 cm and patient is currently on pitocin. Continue care management and Plan to reassess in 3-4 hours.  ?#Pain: received epidural ?#FWB: Cat 1 ?#GBS negative ? ? ?JoAlen BleacherMD ?[facility]:26 PM  ? ? ?Attestation of CNM Supervision of Resident: Evaluation and management procedures were performed by the [facility] under my supervision. I was immediately available for direct supervision, assistance and direction throughout this encounter.  I also confirm that I have verified the information documented in the resident?s note, and that I have also personally reperformed the pertinent components of the physical exam and all of the medical decision making activities.  I have also made any necessary editorial changes. ? ?DaRenee HarderCNM ?03/31/2022 1:41 PM ? ? ?

## 2022-03-31 NOTE — Discharge Summary (Signed)
? ?  Postpartum Discharge Summary ? ? ?   ?Patient Name: Melanie Lopez ?DOB: 1991/10/16 ?MRN: [id number] ? ?Date of admission: 03/30/2022 ?Delivery date:03/31/2022  ?Delivering provider: Maryagnes Amos L  ?Date of discharge: 04/01/2022 ? ?Admitting diagnosis: Chronic hypertension in obstetric context in third trimester [O10.913] ?Intrauterine pregnancy: [redacted]w[redacted]d    ?Secondary diagnosis:  Principal Problem: ?  Chronic hypertension in obstetric context in third trimester ?Active Problems: ?  Supervision of high risk pregnancy, antepartum ?  SVD (spontaneous vaginal delivery) ? ?Additional problems: cHTN    ?Discharge diagnosis: Term Pregnancy Delivered and CHTN                                              ?Post partum procedures: None ?Augmentation: Pitocin and Cytotec ?Complications: None ? ?Hospital course: Induction of Labor With Vaginal Delivery   ?31y.o. yo G2P1001 at 340w2das admitted to the hospital 03/30/2022 for induction of labor.  Indication for induction:  cHTN .  Patient had an uncomplicated labor course as follows: ?Membrane Rupture Time/Date: 11:20 AM ,03/31/2022   ?Delivery Method:Vaginal, Spontaneous  ?Episiotomy: None  ?Lacerations:  2nd degree  ?Details of delivery can be found in separate delivery note.  Patient had a routine postpartum course. Patient is discharged home 04/01/22. She was denying headahces, vision changes, RUQ pain, chest pain, dyspnea. She was counseled on risk of pre-eclampsia, eclampsia, and death and why we feel it is important to take Procardia and Lasix on discharge and follow up for 2 week BP check, and she was agreeable to taking them. ? ?Newborn Data: ?Birth date:03/31/2022  ?Birth time:3:39 PM  ?Gender:Female  ?Living status:Living  ?Apgars:9 ,9  ?Weight:3100 g  ? ?Magnesium Sulfate received: No ?BMZ received: No ?Rhophylac:N/A ?MMR:N/A ?T-DaP:Given prenatally ?Flu: No ?Transfusion:No ? ?Physical exam  ?Vitals:  ? 03/31/22 2001 03/31/22 2329 04/01/22 0516964/24/23 0850   ?BP: 137/82 129/71 130/78 123/77  ?Pulse:    63  ?Resp:  18 18 18   ?Temp:  98.1 ?F (36.7 ?C) 98 ?F (36.7 ?C) 98.2 ?F (36.8 ?C)  ?TempSrc:  Oral Oral Oral  ?SpO2:  100% 100% 99%  ?Weight:      ?Height:      ? ?General: alert, cooperative, and no distress ?Lochia: appropriate ?Uterine Fundus: firm ?Incision: N/A ?DVT Evaluation: No evidence of DVT seen on physical exam. ?Negative Homan's sign. ?No cords or calf tenderness. ?No significant calf/ankle edema. ?Labs: ?Lab Results  ?Component Value Date  ? WBC 11.1 (H) 04/01/2022  ? HGB 11.0 (L) 04/01/2022  ? HCT 33.5 (L) 04/01/2022  ? MCV 86.3 04/01/2022  ? PLT 293 04/01/2022  ? ? ?  Latest Ref Rng & Units 03/30/2022  ?  8:56 PM  ?CMP  ?Glucose 70 - 99 mg/dL 71    ?BUN 6 - 20 mg/dL <5    ?Creatinine 0.44 - 1.00 mg/dL 0.58    ?Sodium 135 - 145 mmol/L 135    ?Potassium 3.5 - 5.1 mmol/L 3.3    ?Chloride 98 - 111 mmol/L 107    ?CO2 22 - 32 mmol/L 20    ?Calcium 8.9 - 10.3 mg/dL 8.5    ?Total Protein 6.5 - 8.1 g/dL 6.5    ?Total Bilirubin 0.3 - 1.2 mg/dL 0.5    ?Alkaline Phos 38 - 126 U/L 97    ?AST 15 - 41 U/L 19    ?  ALT 0 - 44 U/L 15    ? ?Edinburgh Score: ? ?  04/01/2022  ?  8:51 AM  ?Flavia Shipper Postnatal Depression Scale Screening Tool  ?I have been able to laugh and see the funny side of things. 0  ?I have looked forward with enjoyment to things. 0  ?I have blamed myself unnecessarily when things went wrong. 0  ?I have been anxious or worried for no good reason. 2  ?I have felt scared or panicky for no good reason. 0  ?Things have been getting on top of me. 0  ?I have been so unhappy that I have had difficulty sleeping. 0  ?I have felt sad or miserable. 0  ?I have been so unhappy that I have been crying. 0  ?The thought of harming myself has occurred to me. 0  ?Edinburgh Postnatal Depression Scale Total 2  ? ? ? ?After visit meds:  ?Allergies as of 04/01/2022   ? ?   Reactions  ? Flagyl [metronidazole] Hives, Rash  ? Sulfamethoxazole-trimethoprim   ? ?  ? ?  ?Medication List   ?  ? ?TAKE these medications   ? ?acetaminophen 325 MG tablet ?Commonly known as: Tylenol ?Take 2 tablets (650 mg total) by mouth every 4 (four) hours as needed (for pain scale < 4). ?  ?coconut oil Oil ?Apply 1 application. topically as needed. ?  ?famotidine 20 MG tablet ?Commonly known as: PEPCID ?Take 1 tablet (20 mg total) by mouth 2 (two) times daily. ?  ?ferrous sulfate 325 (65 FE) MG tablet ?Take 1 tablet (325 mg total) by mouth daily. ?  ?furosemide 20 MG tablet ?Commonly known as: LASIX ?Take 1 tablet (20 mg total) by mouth daily for 5 days. ?  ?ibuprofen 600 MG tablet ?Commonly known as: ADVIL ?Take 1 tablet (600 mg total) by mouth every 6 (six) hours. ?  ?multivitamin-prenatal 27-0.8 MG Tabs tablet ?Take 1 tablet by mouth daily at 12 noon. ?  ?NIFEdipine 30 MG 24 hr tablet ?Commonly known as: ADALAT CC ?Take 1 tablet (30 mg total) by mouth daily. ?Start taking on: April 02, 2022 ?  ? ?  ? ? ? ?Discharge home in stable condition ?Infant Feeding: Bottle and Breast ?Infant Disposition:home with mother ?Discharge instruction: per After Visit Summary and Postpartum booklet. ?Activity: Advance as tolerated. Pelvic rest for 6 weeks.  ?Diet: routine diet ?Future Appointments: ?Future Appointments  ?Date Time Provider [place]  ?04/08/2022  8:30 AM [facility] NURSE [facility]  ?05/24/2022  8:15 AM Woodroe Mode, MD [facility]  ? ?Follow up Visit: ? Follow-up Information   ? ? Griffin Basil, MD Follow up.   ?Specialty: Obstetrics and Gynecology ?Contact information: ?[address] ?[place] [address] ?[phone] ? ? ?  ?  ? ?  ?  ? ?  ? ? ?Message sent to [facility] on 4/23 by D. Moshe Cipro, CNM ?Please schedule this patient for a In person postpartum visit in 6 weeks with the following provider: Any provider. ?Additional Postpartum F/U:BP check 1 week  ?High risk pregnancy complicated by: HTN ?Delivery mode:  Vaginal, Spontaneous  ?Anticipated Birth Control:   declines ? ? ?04/01/2022 ?Lenoria Chime,  MD ? ? ? ?

## 2022-03-31 NOTE — Lactation Note (Signed)
This note was copied from a baby's chart. ?Lactation Consultation Note ? ?Patient Name: Melanie Lopez ?Today's Date: 03/31/2022 ?Reason for consult: L&D Initial assessment;Term ?Age:29 hours ? ?L&D consult with <60 minutes old infant and P2 mother. Congratulated family on newborn.  ?Baby is skin to skin prone on mother's chest. Mother declines assistance with latch. Mother pumped exclusively for 1 month. Plans the same for this baby. Assisted with hand expression, able to fingerfeed drops of colostrum. Mother verbalizes discomfort with hand expression.  ?Mother will need manual pump once transferred to [facility]. Requests formula to feed now.  ?Discussed STS as ideal transition for infants after birth. Talked about primal reflexes. Explained [place] services availability during postpartum stay. Thanked family for their time.   ?  ? ?Maternal Data ?Has patient been taught Hand Expression?: Yes ?Does the patient have breastfeeding experience prior to this delivery?: Yes ?How long did the patient breastfeed?: Mother pumped exclusively for 1 month. Plans the same for this baby. ? ?Feeding ?Mother's Current Feeding Choice: Breast Milk and Formula ? ?Interventions ?Interventions: Skin to skin;Hand express;Breast massage;Education;Expressed milk ? ?Discharge ?Pump: Personal ?[place] Program: Yes ? ?Consult Status ?Consult Status: Follow-up from L&D ?Date: 03/31/22 ?Follow-up type: In-patient ? ? ? ?Neala Miggins A Higuera Ancidey ?03/31/2022, 4:35 PM ? ? ? ?

## 2022-03-31 NOTE — Progress Notes (Signed)
Melanie Lopez is a 31 y.o. G2P1001 at 62w2dadmitted for induction of labor due to cLiber[place] ? ?Subjective: ?Patient reports she feels well. Pain from cramping is at a 2-3/10 ? ?Objective: ?BP 132/81   Pulse 89   Temp 98.5 ?F (36.9 ?C) (Oral)   Resp 16   Ht '5\' 6"'$  (1.676 m)   Wt 108.9 kg   LMP 06/29/2021 (Exact Date)   BMI 38.74 kg/m?  ?No intake/output data recorded. ?No intake/output data recorded. ? ?FHT:  FHR: 140 bpm, variability: moderate,  accelerations:  Present,  decelerations:  Absent ?UC:   irregular ?SVE:   Dilation: 3 ?Effacement (%): 50 ?Station: -3 ?Exam by:: Dr. DCy Blamer? ?Labs: ?Lab Results  ?Component Value Date  ? WBC 7.5 03/30/2022  ? HGB 10.0 (L) 03/30/2022  ? HCT 30.7 (L) 03/30/2022  ? MCV 86.7 03/30/2022  ? PLT 305 03/30/2022  ? ? ?Assessment / Plan: ?G2P1001 at 355w2ddmitted [facility]? ?Labor:  S/p cytotec x2, now 3 cm and still 50% effaced and -3. Head not well applied. Patient inquiring about starting pitocin. Given she is a multip and now 3 cm we discussed its reasonable to go ahead and start pitocin. ? ?Fetal Wellbeing:  Category I ?Pain Control:   plans epidural ?I/D:   GBS neg ? ? ?#cHTN ?BP normotensive to mild range. NO symptoms. Continue to monitor ? ?AnRenard Matter4/23/2023, 6:59 AM ? ? ?

## 2022-03-31 NOTE — Progress Notes (Signed)
Melanie Lopez is a 31 y.o. G2P1001 at 51w2dadmitted for induction of labor due to cLiber[place] ? ?Subjective: ?Patient reports water broke at 1120. Feeling contractions every few minutes that are getting more painful.  ? ?Objective: ?BP 110/63   Pulse 92   Temp 98.5 ?F (36.9 ?C) (Oral)   Resp 18   Ht '5\' 6"'$  (1.676 m)   Wt 108.9 kg   LMP 06/29/2021 (Exact Date)   BMI 38.74 kg/m?  ?No intake/output data recorded. ?No intake/output data recorded. ? ?FHT:  140 bpm, moderate variability, +15x15 accels, no decels ?UC:  Q2-4 mins ?SVE:   Dilation: 4 ?Effacement (%): 50, 60 ?Station: -2 ?Exam by:: SGeannie Risen RN ? ?Labs: ?Lab Results  ?Component Value Date  ? WBC 7.5 03/30/2022  ? HGB 10.0 (L) 03/30/2022  ? HCT 30.7 (L) 03/30/2022  ? MCV 86.7 03/30/2022  ? PLT 305 03/30/2022  ? ? ?Assessment / Plan: ?G2P1001 at 364w2ddmitted for [facility]? ?Labor: SROM with moderate amount of clear fluid. Continue pitocin titration prn per unit policy.  ? ?Fetal Wellbeing:  Category I ?Pain Control:   plans epidural ?I/D:   GBS neg ? ? ?#cHTN ?BP normotensive to mild range. No symptoms. Continue to monitor ? ? ?DaRenee HarderCNM ?03/31/2022, 11:44 AM ? ? ?

## 2022-03-31 NOTE — Anesthesia Procedure Notes (Signed)
Epidural ?Patient location during procedure: OB ?Start time: 03/31/2022 12:19 PM ?End time: 03/31/2022 12:22 PM ? ?Staffing ?Anesthesiologist: Brennan Bailey, MD ?Performed: anesthesiologist  ? ?Preanesthetic Checklist ?Completed: patient identified, IV checked, risks and benefits discussed, monitors and equipment checked, pre-op evaluation and timeout performed ? ?Epidural ?Patient position: sitting ?Prep: DuraPrep and site prepped and draped ?Patient monitoring: continuous pulse ox, blood pressure and heart rate ?Approach: midline ?Location: L3-L4 ?Injection technique: LOR air ? ?Needle:  ?Needle type: Tuohy  ?Needle gauge: 17 G ?Needle length: 9 cm ?Catheter type: closed end flexible ?Catheter size: 19 Gauge ?Catheter at skin depth: 10 cm ?Test dose: negative and Other (1% lidocaine) ? ?Assessment ?Events: blood not aspirated, injection not painful, no injection resistance, no paresthesia and negative IV test ? ?Additional Notes ?Patient identified. Risks, benefits, and alternatives discussed with patient including but not limited to bleeding, infection, nerve damage, paralysis, failed block, incomplete pain control, headache, blood pressure changes, nausea, vomiting, reactions to medication, itching, and postpartum back pain. Confirmed with bedside nurse the patient's most recent platelet count. Confirmed with patient that they are not currently taking any anticoagulation, have any bleeding history, or any family history of bleeding disorders. Patient expressed understanding and wished to proceed. All questions were answered. Sterile technique was used throughout the entire procedure. Please see nursing notes for vital signs.  ? ?Crisp LOR on first pass. Test dose was given through epidural catheter and negative prior to continuing to dose epidural or start infusion. Warning signs of high block given to the patient including shortness of breath, tingling/numbness in hands, complete motor block, or any concerning  symptoms with instructions to call for help. Patient was given instructions on fall risk and not to get out of bed. All questions and concerns addressed with instructions to call with any issues or inadequate analgesia.  Reason for block:procedure for pain ? ? ? ?

## 2022-04-01 ENCOUNTER — Other Ambulatory Visit (HOSPITAL_COMMUNITY): Payer: Self-pay

## 2022-04-01 LAB — CBC
HCT: 33.5 % — ABNORMAL LOW (ref 36.0–46.0)
Hemoglobin: 11 g/dL — ABNORMAL LOW (ref 12.0–15.0)
MCH: 28.4 pg (ref 26.0–34.0)
MCHC: 32.8 g/dL (ref 30.0–36.0)
MCV: 86.3 fL (ref 80.0–100.0)
Platelets: 293 10*3/uL (ref 150–400)
RBC: 3.88 MIL/uL (ref 3.87–5.11)
RDW: 14.9 % (ref 11.5–15.5)
WBC: 11.1 10*3/uL — ABNORMAL HIGH (ref 4.0–10.5)
nRBC: 0 % (ref 0.0–0.2)

## 2022-04-01 MED ORDER — FUROSEMIDE 20 MG PO TABS
20.0000 mg | ORAL_TABLET | Freq: Every day | ORAL | Status: DC
  Administered 2022-04-01: 20 mg via ORAL
  Filled 2022-04-01: qty 1

## 2022-04-01 MED ORDER — NIFEDIPINE ER OSMOTIC RELEASE 30 MG PO TB24
30.0000 mg | ORAL_TABLET | Freq: Every day | ORAL | Status: DC
  Filled 2022-04-01: qty 1

## 2022-04-01 MED ORDER — FUROSEMIDE 20 MG PO TABS
20.0000 mg | ORAL_TABLET | Freq: Every day | ORAL | 0 refills | Status: DC
  Filled 2022-04-01: qty 5, 5d supply, fill #0

## 2022-04-01 MED ORDER — IBUPROFEN 600 MG PO TABS
600.0000 mg | ORAL_TABLET | Freq: Four times a day (QID) | ORAL | 0 refills | Status: DC
  Filled 2022-04-01: qty 30, 8d supply, fill #0

## 2022-04-01 MED ORDER — ACETAMINOPHEN 325 MG PO TABS: 650.0000 mg | ORAL_TABLET | ORAL | Status: DC | PRN

## 2022-04-01 MED ORDER — NIFEDIPINE ER 30 MG PO TB24
30.0000 mg | ORAL_TABLET | Freq: Every day | ORAL | 0 refills | Status: DC
  Filled 2022-04-01: qty 30, 30d supply, fill #0

## 2022-04-01 MED ORDER — COCONUT OIL OIL: 1.0000 "application " | TOPICAL_OIL | 0 refills | Status: DC | PRN

## 2022-04-01 NOTE — Lactation Note (Signed)
This note was copied from a baby's chart. ?Lactation Consultation Note ?Mom declines Lactation services. ?Mom chooses to pump and bottle feed. Until she can get her milk in she is going to formula feed. ? ?Patient Name: Melanie Lopez ?Today's Date: 04/01/2022 ?  ?Age:29 hours ? ?Maternal Data ?  ? ?Feeding ?  ? ?LATCH Score ?  ? ?  ? ?  ? ?  ? ?  ? ?  ? ? ?Lactation Tools Discussed/Used ?  ? ?Interventions ?  ? ?Discharge ?  ? ?Consult Status ?Consult Status: Complete ? ? ? ?Theodoro Kalata ?04/01/2022, 5:27 AM ? ? ? ?

## 2022-04-01 NOTE — Anesthesia Postprocedure Evaluation (Signed)
Anesthesia Post Note ? ?Patient: Nyelle Wolfson ? ?Procedure(s) Performed: AN AD HOC LABOR EPIDURAL ? ?  ? ?Patient location during evaluation: Mother Baby ?Anesthesia Type: Epidural ?Level of consciousness: oriented, awake and awake and alert ?Pain management: pain level controlled ?Vital Signs Assessment: post-procedure vital signs reviewed and stable ?Respiratory status: respiratory function stable, spontaneous breathing and nonlabored ventilation ?Cardiovascular status: stable ?Postop Assessment: adequate PO intake, able to ambulate, patient able to bend at knees and no apparent nausea or vomiting ?Anesthetic complications: no ? ? ?No notable events documented. ? ?Last Vitals:  ?Vitals:  ? 04/01/22 0513 04/01/22 0850  ?BP: 130/78 123/77  ?Pulse:  63  ?Resp: 18 18  ?Temp: 36.7 ?C 36.8 ?C  ?SpO2: 100% 99%  ?  ?Last Pain:  ?Vitals:  ? 04/01/22 0850  ?TempSrc: Oral  ?PainSc:   ? ?Pain Goal:   ? ?  ?  ?  ?  ?  ?  ?  ? ?Modelle Vollmer ? ? ? ? ?

## 2022-04-01 NOTE — Progress Notes (Signed)
POSTPARTUM PROGRESS NOTE ? ?Post Partum Day 1 ? ?Subjective: ? ?Melanie Lopez is a 31 y.o. [id number] s/p SVD at [redacted]w[redacted]d  No acute events overnight.  Pt denies problems with ambulating, voiding or po intake.  She denies nausea or vomiting.  Pain is well controlled.  She has had flatus. She has not had bowel movement.  Lochia Minimal.  ? ?Objective: ?Blood pressure 130/78, pulse 73, temperature 98 ?F (36.7 ?C), temperature source Oral, resp. rate 18, height '5\' 6"'$  (1.676 m), weight 108.9 kg, last menstrual period 06/29/2021, SpO2 100 %, unknown if currently breastfeeding. ? ?Physical Exam:  ?General: alert, cooperative and no distress ?Chest: no respiratory distress ?Heart:regular rate, distal pulses intact ?Abdomen: soft, nontender,  ?Uterine Fundus: firm, appropriately tender ?DVT Evaluation: No calf swelling or tenderness ?Extremities: no edema ?Skin: warm, dry ? ?Recent Labs  ?  03/31/22 ?1149 04/01/22 ?0357  ?HGB 9.7* 11.0*  ?HCT 30.3* 33.5*  ? ? ?Assessment/Plan: ?Melanie Housleyis a 31y.o. [id number]/p SVD at 366w2d? ?PPD#1 - Doing well ?Contraception: undecided ?Feeding: breast and formula ?Dispo: Plan for discharge. ? ? LOS: 2 days  ? ?WeArmanda MagicMedical Student, ?04/01/2022, 7:02 AM  ? ?

## 2022-04-01 NOTE — Social Work (Signed)
CSW received consult for food insecurities noted during the pregnancy. CSW met with MOB to offer support.  ? ?CSW met with MOB at bedside and introduced CSW role. CSW observed MOB holding the infant and FOB present in the room. MOB was receptive to the visit. MOB reported that she receives [employer] and food stamps. MOB reported that she reached out to [facility] and is now waiting for a return call with the appointment date and time. CSW reminded MOB to call and update the food stamp office as well. CSW provided MOB with a list of community Food Assistance resources as well. MOB was appreciative of the resources provided.  ? ?CSW identifies no further need for intervention and no barriers to discharge at this time.  ? ?Kathrin Greathouse, MSW, LCSW ?[facility] [place]  ?Clinical Social Worker  ?[phone] ?Apr 30, 2022  4:16 PM  ?

## 2022-04-01 NOTE — Progress Notes (Signed)
Patient refused procardia. ?

## 2022-04-08 ENCOUNTER — Ambulatory Visit (INDEPENDENT_AMBULATORY_CARE_PROVIDER_SITE_OTHER): Payer: Medicaid Other

## 2022-04-08 ENCOUNTER — Encounter: Payer: Self-pay | Admitting: Obstetrics and Gynecology

## 2022-04-08 VITALS — BP 137/91 | Wt 225.9 lb

## 2022-04-08 DIAGNOSIS — Z0130 Encounter for examination of blood pressure without abnormal findings: Secondary | ICD-10-CM

## 2022-04-08 NOTE — Progress Notes (Signed)
Pt here today for BP check s/p vaginal delivery on 03/31/22 with CHTN.  Pt reports starting Nifedipine 30 mg po daily in the am.  Last dose yesterday in the am and has not yet taken dose this am.  Pt denies visual changes and headache.  BP LA 136/95.  Rpt BP RA 137/91.  Reviewed results with Elgie Congo, MD who recommends that pt go home take BP medication, wait an hour, and send in BP value through [place].  Notified pt provider recommendation.  Pt agreed.  Pt confirmed pp visit on 05/24/22.  Pt advised to call office with concerns, continue taking BP medication as prescribed, and to monitor for sx's of elevated BP.  Pt verbalized understanding with no further questions.  ? ?Graceann Boileau,RN  ?04/08/22 ?

## 2022-04-19 ENCOUNTER — Telehealth: Payer: Self-pay

## 2022-04-19 NOTE — Telephone Encounter (Signed)
Voicemail left on nurse visit line from [place] an Therapist, sports at the [facility]. Per Misty she conducted a home visit with patient on 04/18/22 and found patient's BP to be 152/97. RN stated that patient has chronic hypertension but is not taking any medication. Per RN patient was prescribed Procardia but has finished this medication. Per RN patient states she is aware her blood pressure is high-"it is because she drank coffee." I attempted to call patient to discuss her blood pressure reading from yesterday as well as inquire about Nifedipine prescription that was sent in on 04/02/22 with a 30 day supply. Patient did not answer phone call, voicemail left encouraging patient to call us back. I also sent patient a message over mychart. Will attempt to call patient again.  ? ?Paulina Fusi, RN ?04/19/22 ?

## 2022-04-22 NOTE — Telephone Encounter (Signed)
Left message for pt stating that we are calling to follow up if she could please give [place] a call or respond to MyChart message.  ? ?Mel Almond, RN  ? ?

## 2022-04-24 ENCOUNTER — Ambulatory Visit: Payer: Medicaid Other

## 2022-04-30 ENCOUNTER — Ambulatory Visit: Payer: Medicaid Other

## 2022-05-02 ENCOUNTER — Ambulatory Visit (INDEPENDENT_AMBULATORY_CARE_PROVIDER_SITE_OTHER): Payer: Medicaid Other

## 2022-05-02 VITALS — BP 136/103 | HR 64 | Wt 230.7 lb

## 2022-05-02 DIAGNOSIS — I10 Essential (primary) hypertension: Secondary | ICD-10-CM

## 2022-05-02 MED ORDER — HYDROCHLOROTHIAZIDE 12.5 MG PO TABS: 25.0000 mg | ORAL_TABLET | Freq: Every day | ORAL | 1 refills | Status: AC

## 2022-05-02 MED ORDER — HYDROCHLOROTHIAZIDE 12.5 MG PO TABS: 25.0000 mg | ORAL_TABLET | Freq: Every day | ORAL | 1 refills | Status: DC

## 2022-05-02 NOTE — Progress Notes (Signed)
Blood Pressure Check Visit  Melanie Lopez is here for blood pressure check following spontaneous vaginal delivery on 03/31/22. BP today is 142/99. BP recheck approximately 5 minutes later 136/103. Patient denies any dizziness, blurred vision, headache, shortness of breath, peripheral edema. Patient was prescribed Nifedipine 30 mg daily but stopped taking the medication about 2 weeks ago due to it giving her headaches. Reviewed all of this with Dr. Elgie Congo. Per Dr. Elgie Congo patient to begin taking Hydrochlorothiazide 25 mg daily and have a BP recheck in 1 week. Prescription sent to patient's pharmacy. I reviewed all of this with the patient. I also reviewed patient's postpartum appointment date and time with her. Patient verbalized understanding and denies any other questions.   Seth Bake, RN 05/02/2022

## 2022-05-09 ENCOUNTER — Ambulatory Visit: Payer: Medicaid Other

## 2022-05-16 ENCOUNTER — Ambulatory Visit (INDEPENDENT_AMBULATORY_CARE_PROVIDER_SITE_OTHER): Payer: Medicaid Other

## 2022-05-16 VITALS — BP 118/85 | HR 68 | Wt 224.7 lb

## 2022-05-16 DIAGNOSIS — Z0130 Encounter for examination of blood pressure without abnormal findings: Secondary | ICD-10-CM

## 2022-05-16 NOTE — Progress Notes (Signed)
Blood Pressure Check Visit  Melanie Lopez is here for blood pressure check following spontaneous vaginal delivery on 03/31/22. BP today is 118/85. Patient denies any dizziness, blurred vision, headache, shortness of breath, peripheral edema. Patient takes 25 mg of Hydrochlorothiazide daily. Per patient she took the medication this morning. Patient states that when she checks her blood pressure at home her blood pressure is always above 90 diastolically. I reviewed this with Dr. Ernestina Patches. Per Dr. Ernestina Patches patient should bring her blood pressure cuff with her to her postpartum appointment next week so we can compare it with our blood pressure machine. I reviewed this with patient. I also reviewed patient's postpartum appointment date and time with her. Patient verbalized understanding and denies any other questions.   Seth Bake, RN 05/16/2022

## 2022-05-24 ENCOUNTER — Ambulatory Visit (INDEPENDENT_AMBULATORY_CARE_PROVIDER_SITE_OTHER): Payer: Medicaid Other | Admitting: Obstetrics & Gynecology

## 2022-05-24 NOTE — Progress Notes (Unsigned)
    [place] Partum Visit Note  Melanie Lopez is a 31 y.o. G42P2002 female who presents for a postpartum visit. She is 7 weeks postpartum following a normal spontaneous vaginal delivery.  I have fully reviewed the prenatal and intrapartum course. The delivery was at 42w2dgestational weeks.  Anesthesia: epidural. Postpartum course has been complicated by HTN management. Baby is doing well. Baby is feeding by bottle - Similac 360 . Bleeding no bleeding. Bowel function is normal. Bladder function is normal. Patient is sexually active. Contraception method is abstinence. Postpartum depression screening: negative.   The pregnancy intention screening data noted above was reviewed. Potential methods of contraception were discussed. The patient elected to proceed with No data recorded.    Health Maintenance Due  Topic Date Due   COVID-19 Vaccine (1) Never done   PAP-Cervical Cytology Screening  Never done    The following portions of the patient's history were reviewed and updated as appropriate: allergies, current medications, past family history, past medical history, past social history, past surgical history, and problem list.  Review of Systems Pertinent items are noted in HPI.  Objective:  LMP 06/29/2021 (Exact Date)    General:  cooperative and no distress   Breasts:  not indicated  Lungs:   Heart:  regular rate and rhythm  Abdomen: Not distended    Wound   GU exam:  not indicated       Assessment:    Postpartum care following vaginal delivery  HTN normal postpartum exam.   Plan:   Essential components of care per ACOG recommendations:  1.  Mood and well being: Patient with negative depression screening today. Reviewed local resources for support.  - Patient tobacco use? No.   - hx of drug use? No.    2. Infant care and feeding:  -Patient currently breastmilk feeding? No.  -Social determinants of health (SDOH) reviewed in EPIC. No concerns  3. Sexuality, contraception  and birth spacing - Patient does not want a pregnancy in the next year.  Desired family size is 3 children.  - Reviewed reproductive life planning. Reviewed contraceptive methods based on pt preferences and effectiveness.  Patient desired Unknown/Not Reported today.   - Discussed birth spacing of 18 months  4. Sleep and fatigue -Encouraged family/partner/community support of 4 hrs of uninterrupted sleep to help with mood and fatigue  5. Physical Recovery  - Discussed patients delivery and complications. She describes her labor as {description:25511} - Patient had a Vaginal, no problems at delivery. Patient had a 2nd degree laceration. Perineal healing reviewed. Patient expressed understanding - Patient has urinary incontinence? No. - Patient is safe to resume physical and sexual activity  6.  Health Maintenance - HM due items addressed Yes - Last pap smear No results found for: "DIAGPAP" Pap smear not done at today's visit.  -Breast Cancer screening indicated? No.  09/2021 pap at [facility]. Chronic Disease/Pregnancy Condition follow up: Hypertension  - PCP follow up  AWoodroe Mode MD  [facility]

## 2022-08-14 IMAGING — US US MFM OB FOLLOW-UP
1 series · 13 of 28 positions shown · non-contrast
Comparison: none

[Series 1: us mfm ob follow-up · 13 of 84 slices shown]
[im 4/84]
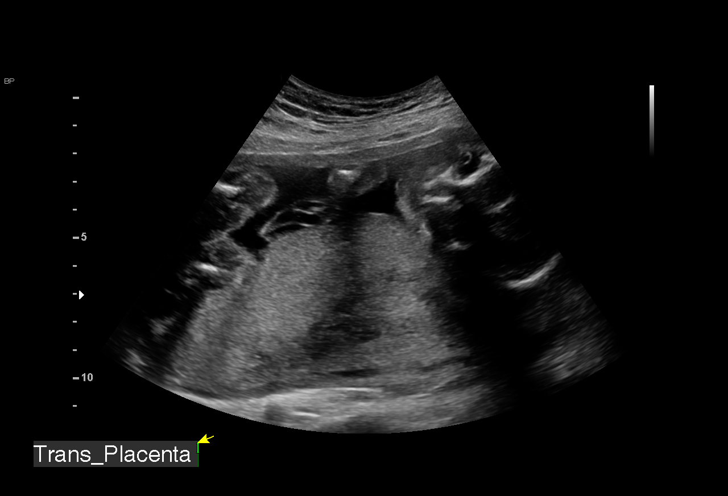
[im 10/84]
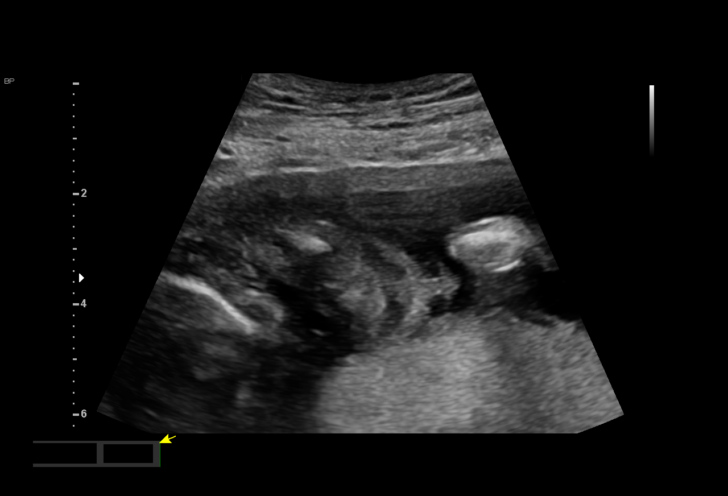
[im 16/84]
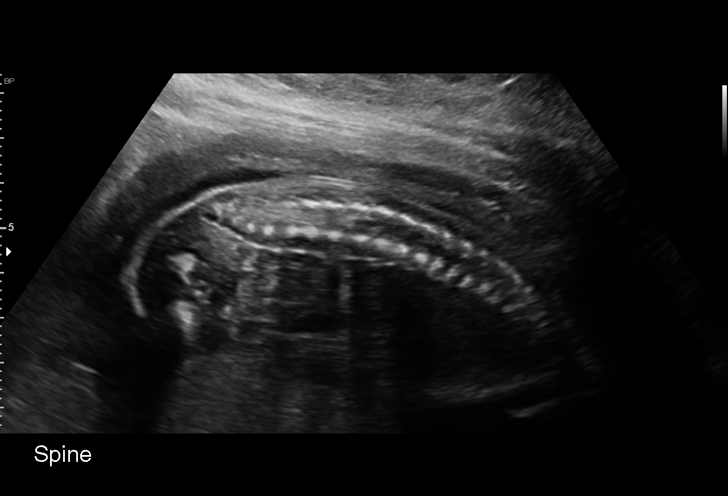
[im 22/84]
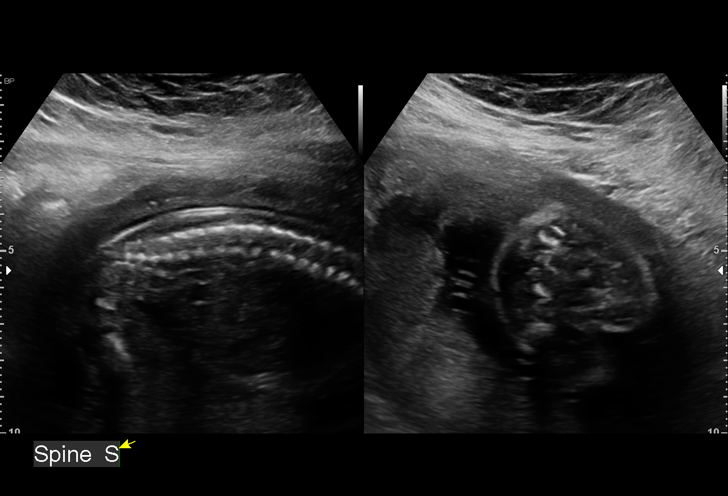
[im 28/84]
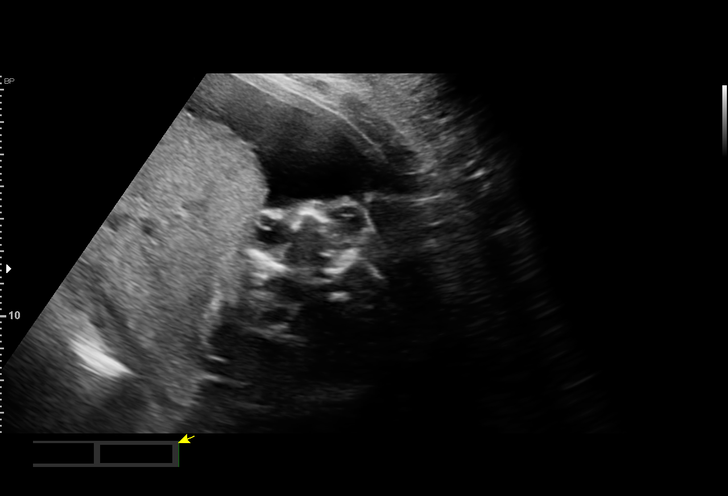
[im 34/84]
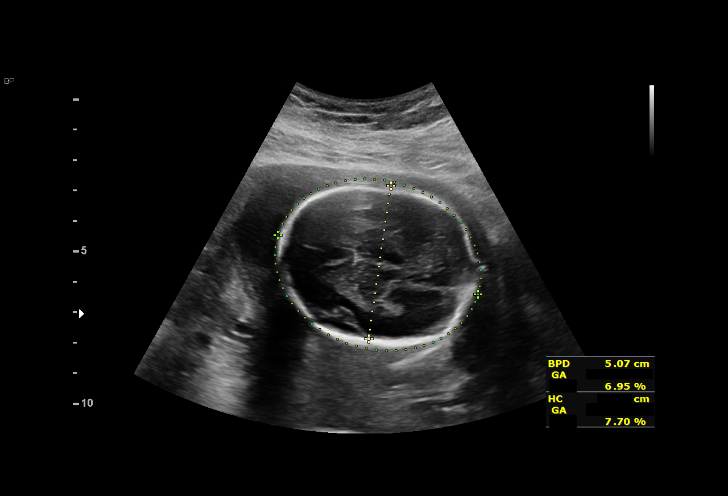
[im 44/84]
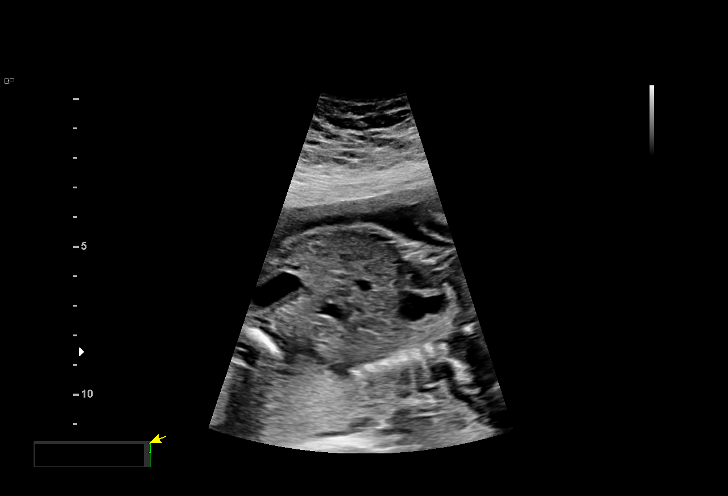
[im 50/84]
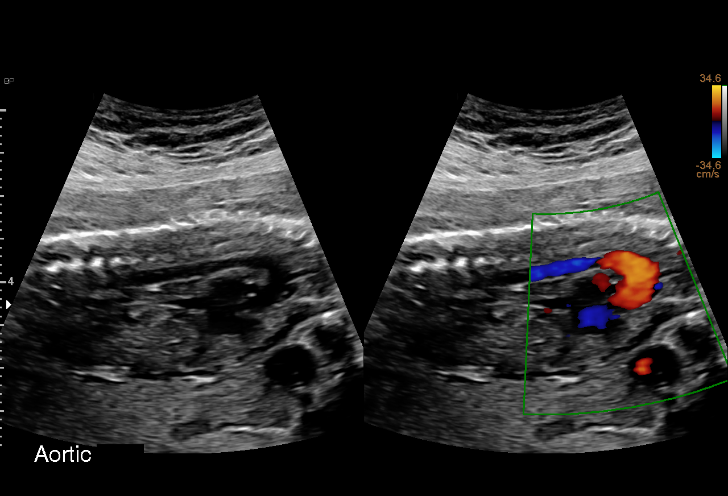
[im 56/84]
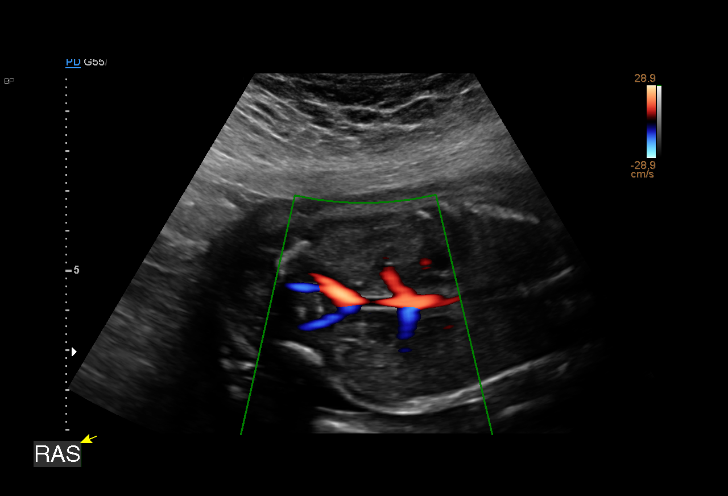
[im 62/84]
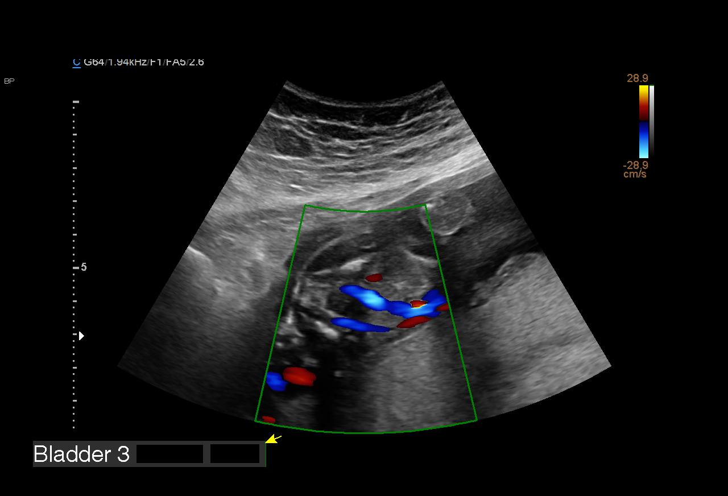
[im 68/84]
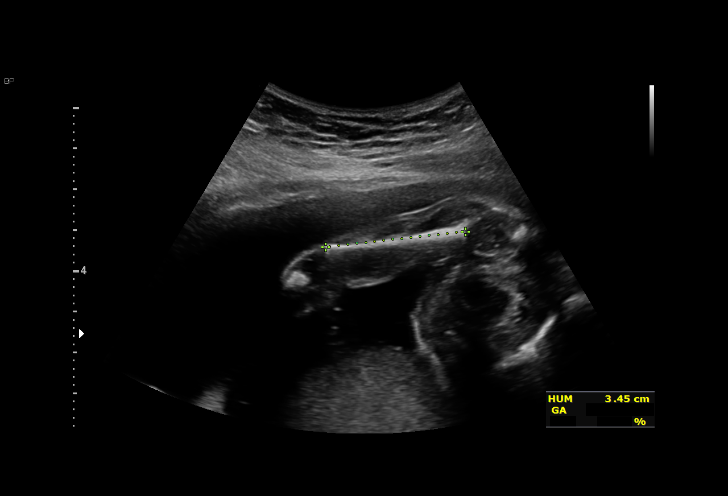
[im 74/84]
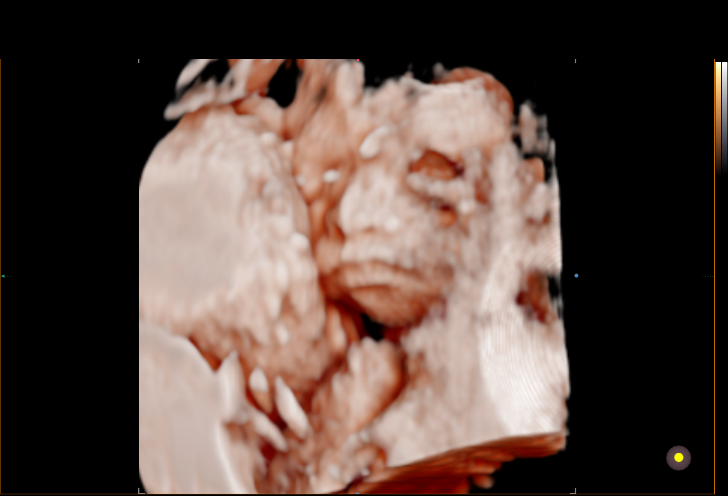
[im 80/84]
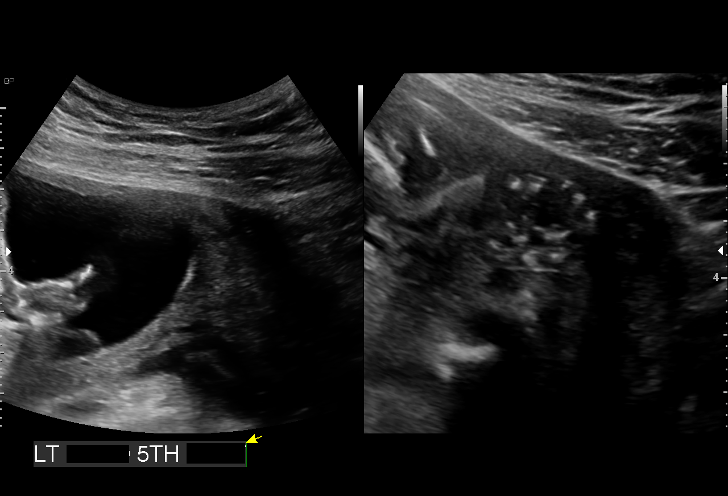

[13 of 28 positions shown; findings below may reference images not displayed]

Indications

 Obesity complicating pregnancy, second
 trimester (BMI 38)
 Uterine fibroids affecting pregnancy in        O34.12,
 second trimester, antepartum
 LR NIPS/Negative Horizon/Negative AFP
 22 weeks gestation of pregnancy
Fetal Evaluation

 Num Of Fetuses:         1
 Fetal Heart Rate(bpm):  147
 Cardiac Activity:       Observed
 Presentation:           Cephalic
 Placenta:               Posterior
 P. Cord Insertion:      Previously Visualized
Biometry

 BPD:      50.3  mm     G. Age:  21w 2d          5  %    CI:        68.24   %    70 - 86
                                                         FL/HC:      19.7   %    19.2 -
 HC:      194.7  mm     G. Age:  21w 5d          7  %    HC/AC:      1.14        1.05 -
 AC:      170.6  mm     G. Age:  22w 0d         21  %    FL/BPD:     76.1   %    71 - 87
 FL:       38.3  mm     G. Age:  22w 2d         25  %    FL/AC:      22.5   %    20 - 24
 HUM:      34.9  mm     G. Age:  22w 0d         29  %
 CER:      24.3  mm     G. Age:  22w 2d         63  %
 LV:        5.1  mm
 CM:        6.2  mm
 Est. FW:     472  gm      1 lb 1 oz     16  %
OB History

 Blood Type:   O+
 Gravidity:    2         Term:   1
 Living:       1
Gestational Age

 LMP:           22w 5d        Date:  06/29/21                 EDD:   04/05/22
 U/S Today:     21w 6d                                        EDD:   04/11/22
 Best:          22w 5d     Det. By:  LMP  (06/29/21)          EDD:   04/05/22
Anatomy

 Cranium:               Appears normal         Aortic Arch:            Appears normal
 Cavum:                 Appears normal         Ductal Arch:            Previously seen
 Ventricles:            Appears normal         Diaphragm:              Appears normal
 Choroid Plexus:        Appears normal         Stomach:                Appears normal, left
                                                                       sided
 Cerebellum:            Appears normal         Abdomen:                Appears normal
 Posterior Fossa:       Appears normal         Abdominal Wall:         Appears nml (cord
                                                                       insert, abd wall)
 Nuchal Fold:           Not applicable (>20    Cord Vessels:           Appears normal (3
                        wks GA)                                        vessel cord)
 Face:                  Appears normal         Kidneys:                Appear normal
                        (orbits and profile)
 Lips:                  Appears normal         Bladder:                Appears normal
 Thoracic:              Appears normal         Spine:                  Appears normal
 Heart:                 Appears normal         Upper Extremities:      Previously seen
                        (4CH, axis, and
                        situs)
 RVOT:                  Previously seen        Lower Extremities:      Previously seen
 LVOT:                  Previously seen

 Other:  Hands visualized. Female gender previously seen.Lenses, VC, 3VV,
         3VTV, Nasal bone, and heels previously visualized. Technically
         difficult due to fetal position.
Cervix Uterus Adnexa

 Cervix
 Length:           3.33  cm.
 Normal appearance by transabdominal scan.

 Uterus
 Normal shape and size.

 Right Ovary
 Not visualized.

 Left Ovary
 Not visualized.

 Cul De Sac
 No free fluid seen.
 Adnexa
 No adnexal mass visualized.
Myomas

 Site                     L(cm)      W(cm)      D(cm)       Location
 Posterior                4.1        3.2        4

 Blood Flow                  RI       PI       Comments

Impression

 Follow up growth due to small for gestational age.
 Normal interval growth with measurements consistent with
 dates however the EFW is at the 16th %
 Good fetal movement and amniotic fluid volume
 The small posterior fibroid is again seen today.
Recommendations

 Follow up growth scheduled in 4-5 weeks given EFW at the
 16th%.

## 2022-09-09 IMAGING — US US MFM OB FOLLOW-UP
1 series · 13 of 28 positions shown · non-contrast
Comparison: none

[Series 1: us mfm ob follow-up · 57 acquisitions, 13 frames shown]
[im 3/57]
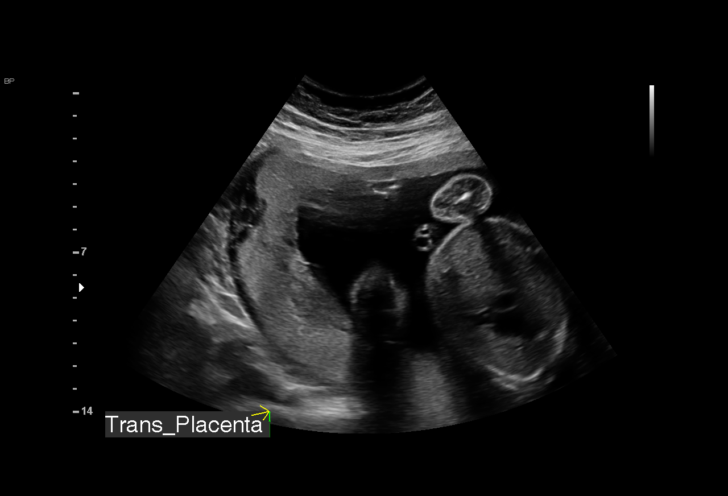
[im 7/57]
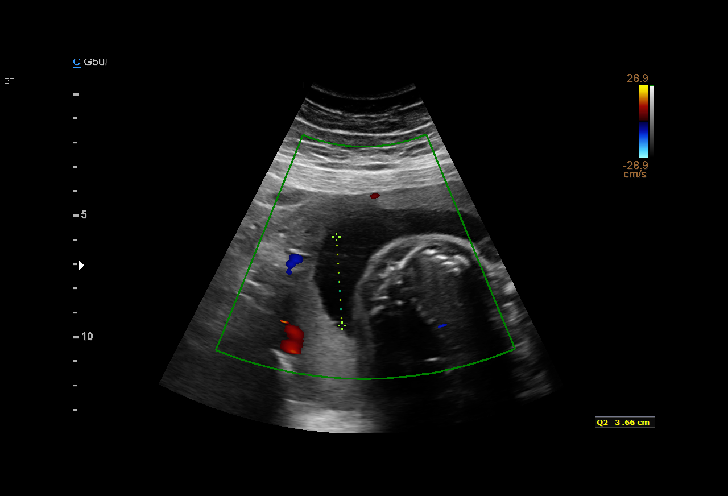
[im 11/57]
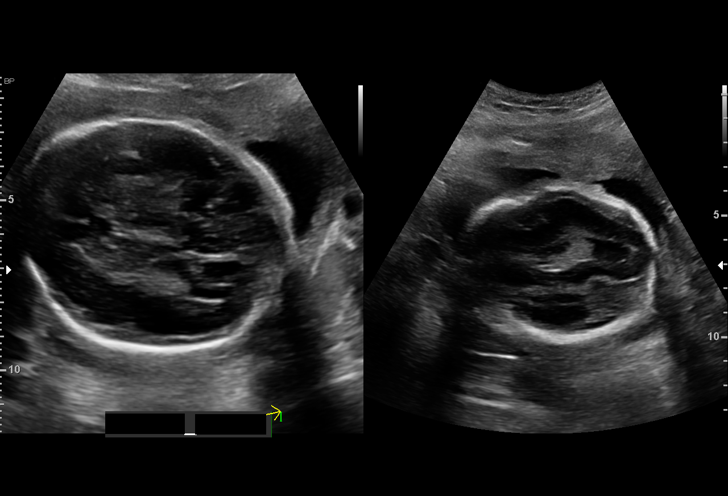
[im 15/57]
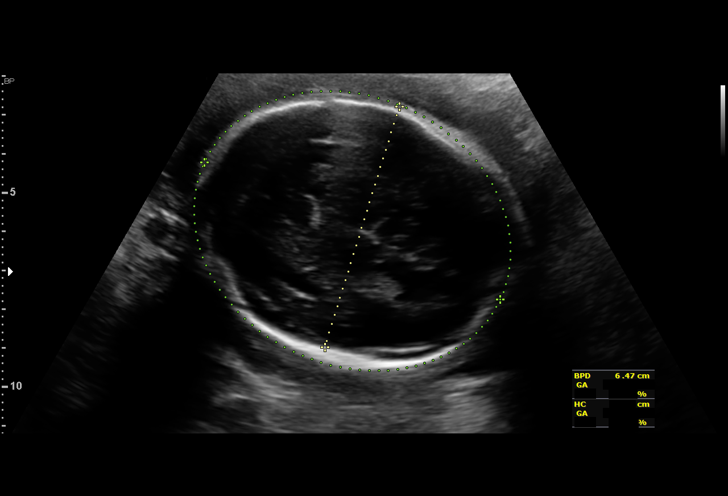
[im 19/57]
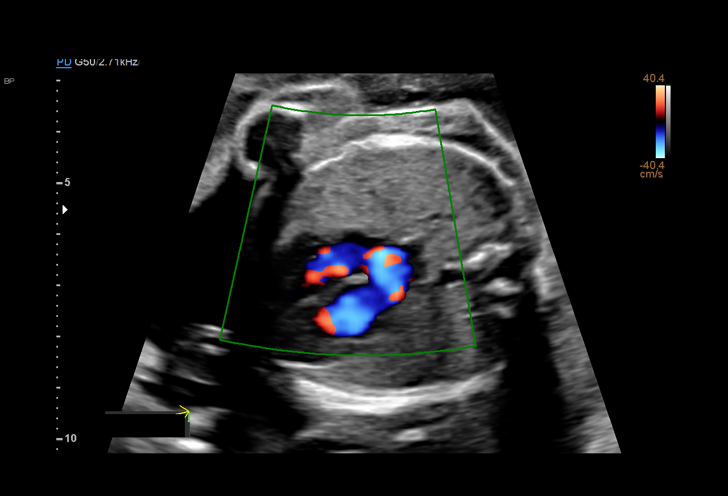
[im 23/57]
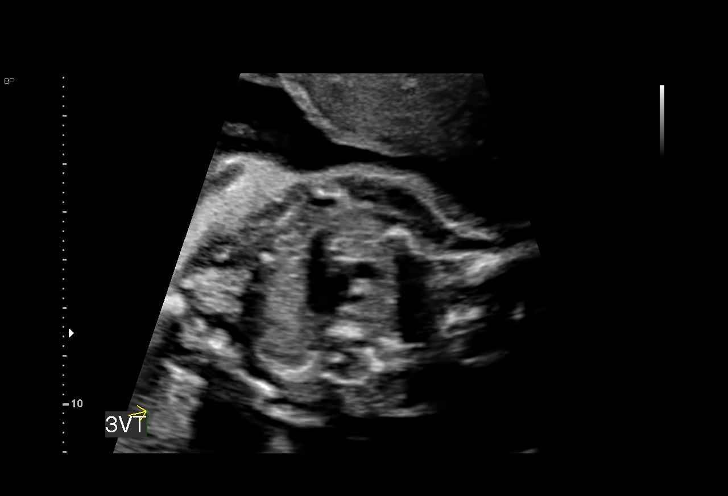
[im 30/57]
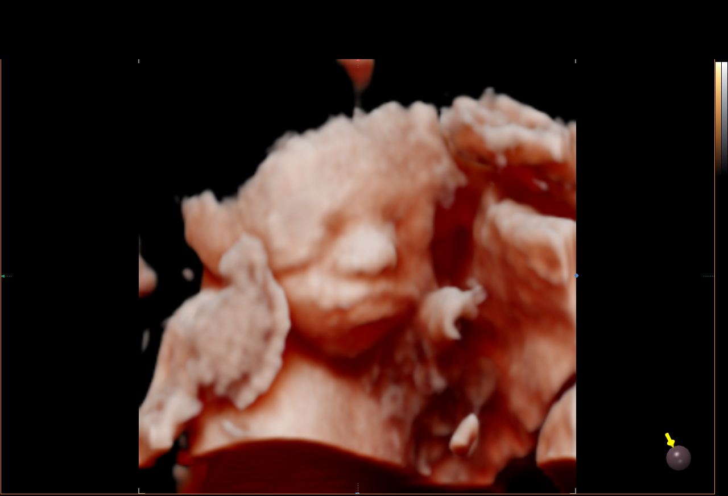
[im 34/57]
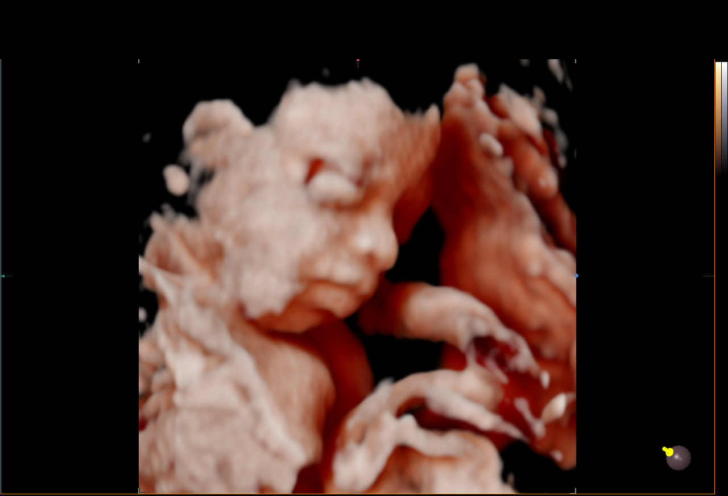
[im 38/57]
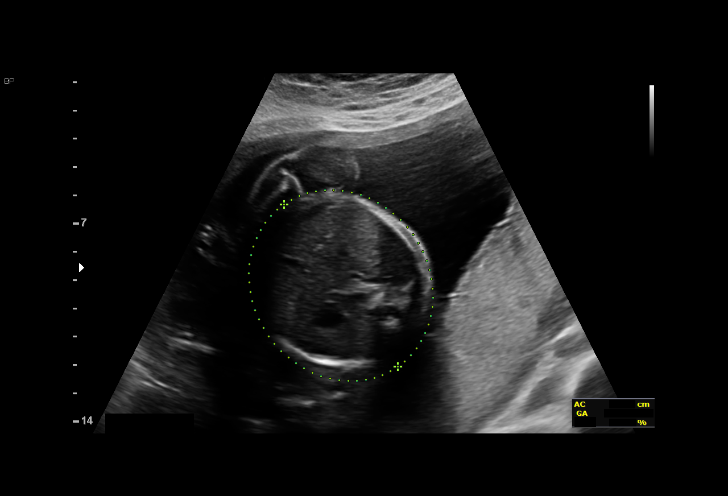
[im 42/57]
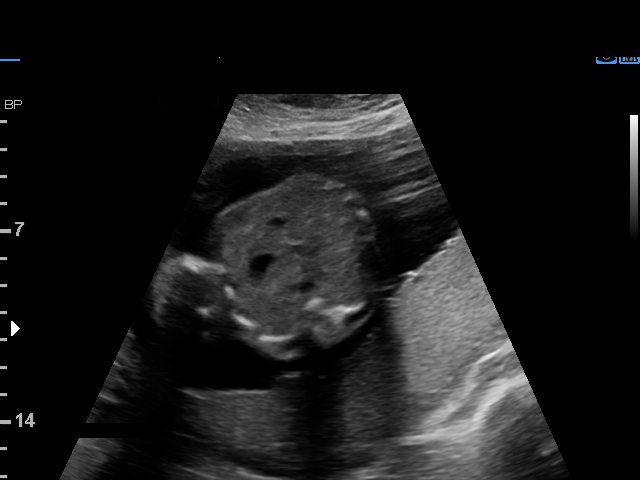
[im 46/57]
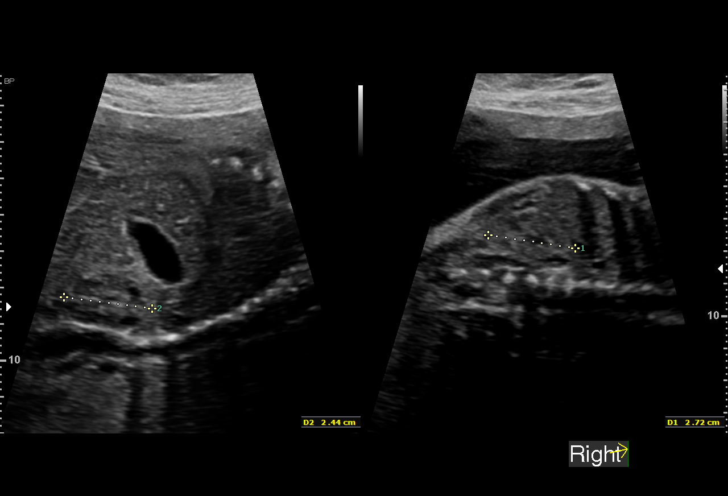
[im 50/57]
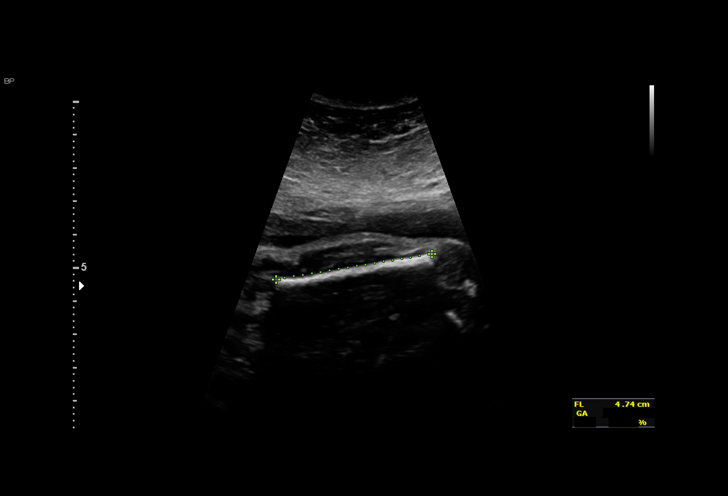
[im 54/57]
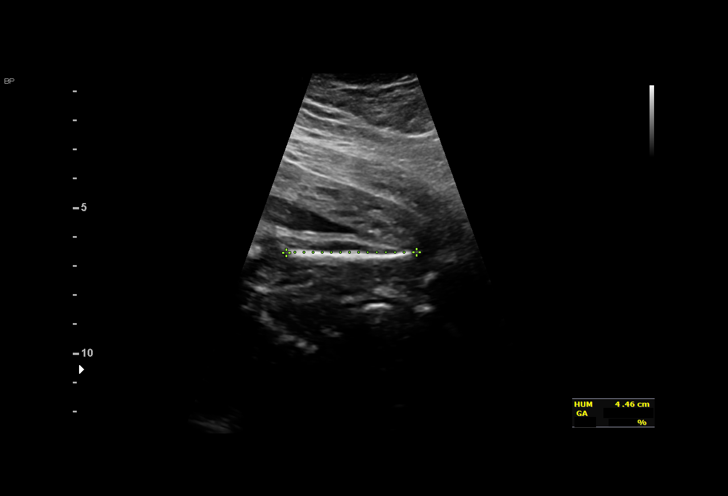

[13 of 28 positions shown; findings below may reference images not displayed]

KRUSE

Indications

 Small for gestational age fetus affecting
 management of mother
 Obesity complicating pregnancy, second
 trimester (BMI 38)
 Uterine fibroids affecting pregnancy in        O34.12,
 second trimester, antepartum
 LR NIPS/Negative Horizon/Negative AFP
 26 weeks gestation of pregnancy
Fetal Evaluation

 Num Of Fetuses:         1
 Fetal Heart Rate(bpm):  141
 Cardiac Activity:       Observed
 Presentation:           Cephalic
 Placenta:               Posterior
 P. Cord Insertion:      Previously Visualized

 Amniotic Fluid
 AFI FV:      Within normal limits

 AFI Sum(cm)     %Tile       Largest Pocket(cm)
 15.89           57

 RUQ(cm)       RLQ(cm)       LUQ(cm)        LLQ(cm)

Biometry

 BPD:      64.9  mm     G. Age:  26w 2d         32  %    CI:        73.41   %    70 - 86
                                                         FL/HC:      19.6   %    18.6 -
 HC:      240.7  mm     G. Age:  26w 1d         16  %    HC/AC:      1.15        1.04 -
 AC:      209.9  mm     G. Age:  25w 4d         17  %    FL/BPD:     72.6   %    71 - 87
 FL:       47.1  mm     G. Age:  25w 5d         17  %    FL/AC:      22.4   %    20 - 24
 HUM:      44.9  mm     G. Age:  26w 4d         50  %

 LV:        3.8  mm

 Est. FW:     841  gm    1 lb 14 oz      14  %
OB History

 Blood Type:   O+
 Gravidity:    2         Term:   1
 Living:       1
Gestational Age

 LMP:           26w 3d        Date:  06/29/21                 EDD:   04/05/22
 U/S Today:     26w 0d                                        EDD:   04/08/22
 Best:          26w 3d     Det. By:  LMP  (06/29/21)          EDD:   04/05/22
Anatomy

 Cranium:               Appears normal         Aortic Arch:            Previously seen
 Cavum:                 Appears normal         Ductal Arch:            Previously seen
 Ventricles:            Appears normal         Diaphragm:              Appears normal
 Choroid Plexus:        Appears normal         Stomach:                Appears normal, left
                                                                       sided
 Cerebellum:            Previously seen        Abdomen:                Previously seen
 Posterior Fossa:       Previously seen        Abdominal Wall:         Previously seen
 Nuchal Fold:           Not applicable (>20    Cord Vessels:           Appears normal (3
                        wks GA)                                        vessel cord)
 Face:                  Appears normal         Kidneys:                Appear normal
                        (orbits and profile)
 Lips:                  Appears normal         Bladder:                Appears normal
 Thoracic:              Appears normal         Spine:                  Previously seen
 Heart:                 Appears normal         Upper Extremities:      Previously seen
                        (4CH, axis, and
                        situs)
 RVOT:                  Appears normal         Lower Extremities:      Previously seen
 LVOT:                  Appears normal

 Other:  Fetus appears to be female. VC, 3VV, 3VTV, Nasal bone, hands, and
         heels previously visualized.
Cervix Uterus Adnexa

 Cervix
 Not visualized (advanced GA >74wks)

 Uterus
 Single fibroid noted, see table below.
 Right Ovary
 Within normal limits.

 Left Ovary
 Within normal limits.

 Cul De Sac
 No free fluid seen.

 Adnexa
 No adnexal mass visualized.
Myomas

 Site                     L(cm)      W(cm)      D(cm)       Location
 Posterior

 Blood Flow                  RI       PI       Comments

Impression

 Patient returned for fetal growth assessment.  On previous
 growth assessment, the estimated fetal weight is at the 16th
 percentile.  Patient had GDM screening today.

 On today's ultrasound, fetal growth is appropriate for
 gestational age.  The estimated fetal weight is at the 14th
 percentile.  Amniotic fluid is normal good fetal activity seen.
 An intramural myoma is seen.
Recommendations

 -An appointment was made for her to return in 4 weeks for
 fetal growth assessment.
                 Kasper, Lace

## 2023-10-23 ENCOUNTER — Encounter (HOSPITAL_BASED_OUTPATIENT_CLINIC_OR_DEPARTMENT_OTHER): Payer: Self-pay

## 2023-10-23 ENCOUNTER — Other Ambulatory Visit: Payer: Self-pay

## 2023-10-23 ENCOUNTER — Emergency Department (HOSPITAL_BASED_OUTPATIENT_CLINIC_OR_DEPARTMENT_OTHER)
Admission: EM | Admit: 2023-10-23 | Discharge: 2023-10-23 | Disposition: A | Discharge status: Home / Self Care | Payer: Medicaid Other | Attending: Emergency Medicine | Admitting: Emergency Medicine

## 2023-10-23 DIAGNOSIS — J020 Streptococcal pharyngitis: Secondary | ICD-10-CM | POA: Insufficient documentation

## 2023-10-23 DIAGNOSIS — J029 Acute pharyngitis, unspecified: Secondary | ICD-10-CM | POA: Diagnosis present

## 2023-10-23 MED ORDER — PENICILLIN G BENZATHINE 1200000 UNIT/2ML IM SUSY
1.2000 10*6.[IU] | PREFILLED_SYRINGE | Freq: Once | INTRAMUSCULAR | Status: AC
  Administered 2023-10-23: 1.2 10*6.[IU] via INTRAMUSCULAR
  Filled 2023-10-23: qty 2

## 2023-10-23 MED ORDER — DEXAMETHASONE 4 MG PO TABS
10.0000 mg | ORAL_TABLET | Freq: Once | ORAL | Status: AC
  Administered 2023-10-23: 10 mg via ORAL
  Filled 2023-10-23: qty 3

## 2023-10-23 NOTE — ED Provider Notes (Signed)
[place] EMERGENCY DEPARTMENT AT [facility] Provider Note   CSN: [id number] Arrival date & time: 10/23/23  2229     History  Chief Complaint  Patient presents with   Sore Throat    Melanie Lopez is a 32 y.o. female.  32 yo F with a chief complaint of a sore throat.  Going on for about 4 days now.  Worse on the left than the right.  Thinks has been coughing a little bit but mostly painful swallowing.  No fevers that she is noticed.  Has had strep before and thinks this feels similar.   Sore Throat       Home Medications Prior to Admission medications   Medication Sig Start Date End Date Taking? Authorizing Provider  acetaminophen (TYLENOL) 325 MG tablet Take 2 tablets (650 mg total) by mouth every 4 (four) hours as needed (for pain scale < 4). Patient not taking: Reported on 05/24/2022 04/01/22   Billey Co, MD  coconut oil OIL Apply 1 application. topically as needed. Patient not taking: Reported on 05/02/2022 04/01/22   Billey Co, MD  famotidine (PEPCID) 20 MG tablet Take 1 tablet (20 mg total) by mouth 2 (two) times daily. Patient not taking: Reported on 04/08/2022 09/28/21 09/28/22  Rasch, Victorino Dike I, NP  ferrous sulfate 325 (65 FE) MG tablet Take 1 tablet (325 mg total) by mouth daily. Patient not taking: Reported on 04/08/2022 01/01/22 01/01/23  Allayne Stack, DO  furosemide (LASIX) 20 MG tablet Take 1 tablet (20 mg total) by mouth daily for 5 days. Patient not taking: Reported on 05/02/2022 04/01/22 04/06/22  Billey Co, MD  hydrochlorothiazide (HYDRODIURIL) 12.5 MG tablet Take 2 tablets (25 mg total) by mouth daily. 05/02/22   Warden Fillers, MD  ibuprofen (ADVIL) 600 MG tablet Take 1 tablet (600 mg total) by mouth every 6 (six) hours. Patient not taking: Reported on 05/16/2022 04/01/22   Billey Co, MD  NIFEdipine (ADALAT CC) 30 MG 24 hr tablet Take 1 tablet (30 mg total) by mouth daily. Patient not taking: Reported on 05/02/2022 04/02/22  05/02/22  Billey Co, MD  Prenatal Vit-Fe Fumarate-FA (MULTIVITAMIN-PRENATAL) 27-0.8 MG TABS tablet Take 1 tablet by mouth daily at 12 noon. Patient not taking: Reported on 05/02/2022    [provider]      Allergies    Flagyl [metronidazole] and Sulfamethoxazole-trimethoprim    Review of Systems   Review of Systems  Physical Exam Updated Vital Signs BP (!) 150/97 (BP Location: Right Arm)   Pulse 88   Temp 98.6 F (37 C) (Oral)   Ht 5\' 6"  (1.676 m)   Wt 117.9 kg   LMP 10/03/2023 (Exact Date)   SpO2 99%   BMI 41.97 kg/m  Physical Exam Vitals and nursing note reviewed.  Constitutional:      General: She is not in acute distress.    Appearance: She is well-developed. She is not diaphoretic.  HENT:     Head: Normocephalic and atraumatic.     Mouth/Throat:     Tonsils: Tonsillar exudate present. 1+ on the right. 2+ on the left.     Comments: Tonsillar swelling with exudates worse in the left than the right.  Left anterior cervical lymphadenopathy.  Uvula is midline tolerating secretions out difficulty able to rotate her neck without pain. Eyes:     Pupils: Pupils are equal, round, and reactive to light.  Cardiovascular:     Rate and Rhythm: Normal rate and  regular rhythm.     Heart sounds: No murmur heard.    No friction rub. No gallop.  Pulmonary:     Effort: Pulmonary effort is normal.     Breath sounds: No wheezing or rales.  Abdominal:     General: There is no distension.     Palpations: Abdomen is soft.     Tenderness: There is no abdominal tenderness.  Musculoskeletal:        General: No tenderness.     Cervical back: Normal range of motion and neck supple.  Skin:    General: Skin is warm and dry.  Neurological:     Mental Status: She is alert and oriented to person, place, and time.  Psychiatric:        Behavior: Behavior normal.     ED Results / Procedures / Treatments   Labs (all labs ordered are listed, but only abnormal results are  displayed) Labs Reviewed - No data to display  EKG None  Radiology No results found.  Procedures Procedures    Medications Ordered in ED Medications  penicillin g benzathine (BICILLIN LA) 1200000 UNIT/2ML injection 1.2 Million Units (has no administration in time range)  dexamethasone (DECADRON) tablet 10 mg (has no administration in time range)    ED Course/ Medical Decision Making/ A&P                                 Medical Decision Making Risk Prescription drug management.   32 yo F with a chief complaint of a sore throat.  Going on for about 4 days.  Clinically the patient has strep pharyngitis.  Will treat with penicillin.  Dose of Decadron.  PCP follow-up.  10:40 PM:  I have discussed the diagnosis/risks/treatment options with the patient.  Evaluation and diagnostic testing in the emergency department does not suggest an emergent condition requiring admission or immediate intervention beyond what has been performed at this time.  They will follow up with PCP. We also discussed returning to the ED immediately if new or worsening sx occur. We discussed the sx which are most concerning (e.g., sudden worsening pain, fever, inability to tolerate by mouth) that necessitate immediate return. Medications administered to the patient during their visit and any new prescriptions provided to the patient are listed below.  Medications given during this visit Medications  penicillin g benzathine (BICILLIN LA) 1200000 UNIT/2ML injection 1.2 Million Units (has no administration in time range)  dexamethasone (DECADRON) tablet 10 mg (has no administration in time range)     The patient appears reasonably screen and/or stabilized for discharge and I doubt any other medical condition or other [facility] requiring further screening, evaluation, or treatment in the ED at this time prior to discharge.          Final Clinical Impression(s) / ED Diagnoses Final diagnoses:  Strep pharyngitis     Rx / DC Orders ED Discharge Orders     None         Melene Plan, DO 10/23/23 2240

## 2023-10-23 NOTE — Discharge Instructions (Signed)
Take tylenol 2 pills 4 times a day and motrin 4 pills 3 times a day.  Drink plenty of fluids.  Return for worsening shortness of breath, headache, confusion. Follow up with your family doctor.   

## 2023-10-23 NOTE — ED Triage Notes (Signed)
Pt reports sore throat x 4 days.  States she feels she has strep, hx of strep throat before

## 2024-12-02 ENCOUNTER — Encounter (HOSPITAL_BASED_OUTPATIENT_CLINIC_OR_DEPARTMENT_OTHER): Payer: Self-pay | Admitting: Emergency Medicine

## 2024-12-02 ENCOUNTER — Other Ambulatory Visit: Payer: Self-pay

## 2024-12-02 ENCOUNTER — Emergency Department (HOSPITAL_BASED_OUTPATIENT_CLINIC_OR_DEPARTMENT_OTHER)
Admission: EM | Admit: 2024-12-02 | Discharge: 2024-12-02 | Disposition: A | Discharge status: Home / Self Care | Attending: Emergency Medicine | Admitting: Emergency Medicine

## 2024-12-02 DIAGNOSIS — N760 Acute vaginitis: Secondary | ICD-10-CM | POA: Diagnosis not present

## 2024-12-02 DIAGNOSIS — B9689 Other specified bacterial agents as the cause of diseases classified elsewhere: Secondary | ICD-10-CM | POA: Diagnosis not present

## 2024-12-02 DIAGNOSIS — B3731 Acute candidiasis of vulva and vagina: Secondary | ICD-10-CM | POA: Insufficient documentation

## 2024-12-02 DIAGNOSIS — N898 Other specified noninflammatory disorders of vagina: Secondary | ICD-10-CM | POA: Diagnosis present

## 2024-12-02 LAB — WET PREP, GENITAL
Sperm: NONE SEEN
Trich, Wet Prep: NONE SEEN
WBC, Wet Prep HPF POC: >=10 — AB

## 2024-12-02 LAB — PREGNANCY, URINE: Preg Test, Ur: NEGATIVE

## 2024-12-02 LAB — URINALYSIS, ROUTINE W REFLEX MICROSCOPIC
Bacteria, UA: NONE SEEN
Bilirubin Urine: NEGATIVE
Glucose, UA: NEGATIVE mg/dL
Hgb urine dipstick: NEGATIVE
Ketones, ur: NEGATIVE mg/dL
Nitrite: NEGATIVE
Protein, ur: NEGATIVE mg/dL
Specific Gravity, Urine: 1.026 (ref 1.005–1.030)
pH: 7 (ref 5.0–8.0)

## 2024-12-02 MED ORDER — FLUCONAZOLE 150 MG PO TABS: 150.0000 mg | ORAL_TABLET | Freq: Once | ORAL | 0 refills | Status: AC

## 2024-12-02 MED ORDER — CLINDAMYCIN HCL 150 MG PO CAPS: 300.0000 mg | ORAL_CAPSULE | Freq: Two times a day (BID) | ORAL | 0 refills | Status: AC

## 2024-12-02 NOTE — ED Provider Notes (Signed)
 "  [place] EMERGENCY DEPARTMENT AT [facility]  Provider Note  CSN: [id number] Arrival date & time: 12/02/24 0354  History Chief Complaint  Patient presents with   Vaginal Discharge    Melanie Lopez is a 33 y.o. female reports 4-5 days of thick white vaginal discharge. No urinary symptoms. Unsure of STI exposure but has had unprotected sex. No rash or sores.    Home Medications Prior to Admission medications  Medication Sig Start Date End Date Taking? Authorizing Provider  clindamycin  (CLEOCIN ) 150 MG capsule Take 2 capsules (300 mg total) by mouth 2 (two) times daily for 7 days. 12/02/24 12/09/24 Yes Roselyn Carlin NOVAK, MD  fluconazole  (DIFLUCAN ) 150 MG tablet Take 1 tablet (150 mg total) by mouth once for 1 dose. 12/02/24 12/02/24 Yes Roselyn Carlin NOVAK, MD  hydrochlorothiazide  (HYDRODIURIL ) 12.5 MG tablet Take 2 tablets (25 mg total) by mouth daily. 05/02/22   Zina Jerilynn LABOR, MD     Allergies    Flagyl [metronidazole] and Sulfamethoxazole-trimethoprim   Review of Systems   Review of Systems Please see HPI for pertinent positives and negatives  Physical Exam BP (!) 149/90 (BP Location: Right Arm)   Pulse 71   Temp 98.3 F (36.8 C) (Oral)   Resp 17   Ht 5' 6 (1.676 m)   Wt 117.9 kg   LMP 11/11/2024 (Exact Date)   SpO2 97%   BMI 41.97 kg/m   Physical Exam Vitals and nursing note reviewed.  HENT:     Head: Normocephalic.     Nose: Nose normal.  Eyes:     Extraocular Movements: Extraocular movements intact.  Pulmonary:     Effort: Pulmonary effort is normal.  Musculoskeletal:        General: Normal range of motion.     Cervical back: Neck supple.  Skin:    Findings: No rash (on exposed skin).  Neurological:     Mental Status: She is alert and oriented to person, place, and time.  Psychiatric:        Mood and Affect: Mood normal.     ED Results / Procedures / Treatments   EKG None  Procedures Procedures  Medications Ordered in the  ED Medications - No data to display  Initial Impression and Plan  Patient here for vaginal discharge. Will check UA, HCG, self swabs.   ED Course   Clinical Course as of 12/02/24 0450  Thu Dec 02, 2024  0442 UA is clear, HCG is neg. Wet prep positive for yeast and BV. Will treat accordingly; she would like to defer empiric treatment for GC/CT until that swab results.  [CS]    Clinical Course User Index [CS] Roselyn Carlin NOVAK, MD     MDM Rules/Calculators/A&P Medical Decision Making Problems Addressed: Bacterial vaginosis: acute illness or injury Vaginal yeast infection: acute illness or injury  Amount and/or Complexity of Data Reviewed Labs: ordered. Decision-making details documented in ED Course.  Risk Prescription drug management.     Final Clinical Impression(s) / ED Diagnoses Final diagnoses:  Vaginal yeast infection  Bacterial vaginosis    Rx / DC Orders ED Discharge Orders          Ordered    fluconazole  (DIFLUCAN ) 150 MG tablet   Once        12/02/24 0450    clindamycin  (CLEOCIN ) 150 MG capsule  2 times daily        12/02/24 0450  Roselyn Carlin NOVAK, MD 12/02/24 [phone]  "

## 2024-12-02 NOTE — ED Triage Notes (Signed)
 Pt concerned for yeast infection, c/o itching and white discharge x 3 days, denies urinary symptoms

## 2024-12-03 LAB — GC/CHLAMYDIA PROBE AMP (~~LOC~~) NOT AT ARMC
Chlamydia: NEGATIVE
Comment: NEGATIVE
Comment: NORMAL
Neisseria Gonorrhea: NEGATIVE
